# Patient Record
Sex: Male | Born: 1970 | Race: White | Hispanic: No | Marital: Married | Smoking: Never smoker
Health system: Southern US, Community
[De-identification: ages and names within clinical notes are randomized; demographics above are authoritative.]

## PROBLEM LIST (undated history)

## (undated) DIAGNOSIS — J45909 Unspecified asthma, uncomplicated: Secondary | ICD-10-CM

## (undated) DIAGNOSIS — J309 Allergic rhinitis, unspecified: Secondary | ICD-10-CM

## (undated) HISTORY — DX: Unspecified asthma, uncomplicated: J45.909

## (undated) HISTORY — DX: Allergic rhinitis, unspecified: J30.9

---

## 2003-03-03 ENCOUNTER — Encounter: Payer: Self-pay | Admitting: Emergency Medicine

## 2003-03-03 ENCOUNTER — Emergency Department (HOSPITAL_COMMUNITY): Admission: EM | Admit: 2003-03-03 | Discharge: 2003-03-03 | Payer: Self-pay

## 2004-11-27 ENCOUNTER — Ambulatory Visit (HOSPITAL_COMMUNITY): Admission: RE | Admit: 2004-11-27 | Discharge: 2004-11-27 | Payer: Self-pay | Admitting: Family Medicine

## 2004-11-27 ENCOUNTER — Encounter (INDEPENDENT_AMBULATORY_CARE_PROVIDER_SITE_OTHER): Payer: Self-pay | Admitting: Cardiology

## 2006-03-20 ENCOUNTER — Ambulatory Visit: Payer: Self-pay | Admitting: Internal Medicine

## 2006-08-06 ENCOUNTER — Ambulatory Visit: Payer: Self-pay | Admitting: Internal Medicine

## 2007-04-06 ENCOUNTER — Emergency Department (HOSPITAL_COMMUNITY): Admission: EM | Admit: 2007-04-06 | Discharge: 2007-04-06 | Payer: Self-pay | Admitting: Emergency Medicine

## 2007-04-08 ENCOUNTER — Encounter: Admission: RE | Admit: 2007-04-08 | Discharge: 2007-04-08 | Payer: Self-pay | Admitting: Sports Medicine

## 2007-08-17 ENCOUNTER — Ambulatory Visit: Payer: Self-pay | Admitting: Internal Medicine

## 2007-10-28 ENCOUNTER — Emergency Department (HOSPITAL_COMMUNITY): Admission: EM | Admit: 2007-10-28 | Discharge: 2007-10-29 | Payer: Self-pay | Admitting: Emergency Medicine

## 2007-12-08 ENCOUNTER — Ambulatory Visit: Payer: Self-pay | Admitting: Internal Medicine

## 2007-12-08 DIAGNOSIS — J45998 Other asthma: Secondary | ICD-10-CM

## 2007-12-08 DIAGNOSIS — J3089 Other allergic rhinitis: Secondary | ICD-10-CM

## 2007-12-08 DIAGNOSIS — J4520 Mild intermittent asthma, uncomplicated: Secondary | ICD-10-CM | POA: Insufficient documentation

## 2007-12-08 DIAGNOSIS — J302 Other seasonal allergic rhinitis: Secondary | ICD-10-CM

## 2008-02-17 ENCOUNTER — Ambulatory Visit: Payer: Self-pay | Admitting: Internal Medicine

## 2009-02-15 ENCOUNTER — Ambulatory Visit: Payer: Self-pay | Admitting: Internal Medicine

## 2009-04-11 ENCOUNTER — Telehealth (INDEPENDENT_AMBULATORY_CARE_PROVIDER_SITE_OTHER): Payer: Self-pay | Admitting: *Deleted

## 2009-12-26 ENCOUNTER — Telehealth (INDEPENDENT_AMBULATORY_CARE_PROVIDER_SITE_OTHER): Payer: Self-pay | Admitting: *Deleted

## 2010-01-01 ENCOUNTER — Ambulatory Visit: Payer: Self-pay | Admitting: Internal Medicine

## 2010-01-01 DIAGNOSIS — J019 Acute sinusitis, unspecified: Secondary | ICD-10-CM | POA: Insufficient documentation

## 2010-02-27 ENCOUNTER — Telehealth (INDEPENDENT_AMBULATORY_CARE_PROVIDER_SITE_OTHER): Payer: Self-pay | Admitting: *Deleted

## 2010-11-04 ENCOUNTER — Encounter: Payer: Self-pay | Admitting: Family Medicine

## 2010-11-15 NOTE — Progress Notes (Signed)
Summary: appt  Phone Note Call from Patient Call back at [phone]   Caller: Patient Call For: young Reason for Call: Talk to Nurse Summary of Call: asthma related congestion in head and chest area.  Cough, wheezing, worse at night.  Want to see Dr. Maple Hudson or NP Initial call taken by: Eugene Gavia,  December 26, 2009 10:46 AM  Follow-up for Phone Call        called and spoke with pt.   pt c/o head congestion, difficulty blowing anything out of nose, facial pressure, wheezing at night.  symptoms started 3 weeks ago.  pt denied cough or fever.  pt states his pcp gave him Bactrium a while ago which helped but didn't clear up symptoms. Please advise.  Thank you.  Aundra Millet Reynolds LPN  December 26, 2009 10:53 AM   allergies:  PCN,  tetracycline  Additional Follow-up for Phone Call Additional follow up Details #1::        Offer prednisone 20 mg , # 5, 1 daily. Appointment with me. Additional Follow-up by: Waymon Budge MD,  December 26, 2009 1:23 PM    Additional Follow-up for Phone Call Additional follow up Details #2::    Katie, can you please clarify CY response. Doe she want pt to have appt this week? If so where? does he want me to give pt pred rx and appt? Please advise. Carron Curie CMA  December 26, 2009 2:13 PM  Please have pt come in to see CDY Friday in 1115am slot and be here at 11am for appt.Reynaldo Minium CMA  December 27, 2009 2:24 PM   Appt sched for 12/29/09 at 11:15 with Dr Maple Hudson.  Advised pt to come in at 11 am.  Pt verbalized understanding. Follow-up by: Vernie Murders,  December 27, 2009 2:30 PM

## 2010-11-15 NOTE — Progress Notes (Signed)
Summary: [id number]  Phone Note Call from Patient Call back at Home Phone [phone]   Caller: Patient Call For: YOUNG Summary of Call: NEEDS REFILL ON [facility] Initial call taken by: Lacinda Axon,  Feb 27, 2010 12:58 PM  Follow-up for Phone Call        nasonex not on med list, but flonase is? LMTCB to ask pt which one he is taking. Carron Curie CMA  Feb 27, 2010 1:24 PM  Returning phone call, pt cell is [phone] Darletta Moll  Feb 27, 2010 1:28 PM  [facility].  Arman Filter LPN  Feb 27, 2010 2:44 PM   LMOMTCB Vernie Murders  Feb 27, 2010 3:35 PM     Additional Follow-up for Phone Call Additional follow up Details #1::        Spoke with pt.  He states that it is the flonase that he has been taking and not nasonex.  Rx was refilled. Additional Follow-up by: Vernie Murders,  Feb 27, 2010 3:40 PM    Prescriptions: Aleda Grana 50 MCG/ACT SUSP (FLUTICASONE PROPIONATE) 1-2 sprays in each nostril once daily as needed  #1 x 5   Entered by:   Vernie Murders   Authorized by:   Waymon Budge MD   Signed by:   Vernie Murders on 02/27/2010   Method used:   Electronically to        [facility] [address] [phone]* (retail)       [address] [place], [place]  [address]       Ph: [phone]       Fax: [phone]   RxID:   [id number]

## 2010-11-15 NOTE — Assessment & Plan Note (Signed)
Summary: Acute NP office visit - sinus inf   Primary Provider/Referring Provider:  Bradd Canary  CC:  sinus pressure/congestion with yellow drainage and PND causing chest tightness x3weeks - was given bactrim for the sx by PCP 3wks ago.  would like refills of theophylline and proventil sent to [facility]  History of Present Illness: 5/6/09ALLERGIC RHINITIS (ICD-477.9) ASTHMA (ICD-493.90)  [name] returns for follow-up, being seen once per year.  He feels well today, needing refills.  He admits the pollen season is causing nasal congestion and pressure in his sinuses and ears.  His eyes itch, and water.  This is a problem for two or 3 weeks each year.  He does not get chest tightness, or wheeze.  Theophylline works well for him as a maintenance and preventive medicine taking 300 mg twice daily.  We discussed theophylline side effects. He denies breathing trouble at night and is not told that he snores.  02/15/09 Asthma, allergic rhinitis Notes chest tight after lawn mowing. Used flonase and claritin appropriately during heaviest spring pollen. Asks 90 day med refils. Rarely needing rescue inhaler- discused rescue vs maintenance.  January 01, 2010 --Presents for an acute office visit Complains of sinus pressure/congestion with yellow drainage, PND causing chest tightness x3weeks - was given bactrim for the sx by PCP 3wks ago.  would like refills of theophylline and proventil sent to [facility].. Denies chest pain, dyspnea, orthopnea, hemoptysis, fever, n/v/d, edema, headache.  Sinus pressure will not clear.    Medications Prior to Update: 1)  Theophylline Cr 300 Mg  Tb12 (Theophylline) .... Take 1 Tablet By Mouth Two Times A Day 2)  Claritin 10 Mg  Tabs (Loratadine) .... As Needed 3)  Proventil Hfa 108 (90 Base) Mcg/act  Aers (Albuterol Sulfate) .Marland Kitchen.. 1-2 Puffs Every 4-6 Hours As Needed 4)  Flonase 50 Mcg/act Susp (Fluticasone Propionate) .Marland Kitchen.. 1-2 Sprays in Each Nostril Once Daily As Needed  Current  Medications (verified): 1)  Theophylline Cr 300 Mg  Tb12 (Theophylline) .... Take 1 Tablet By Mouth Two Times A Day 2)  Claritin 10 Mg  Tabs (Loratadine) .... As Needed 3)  Proventil Hfa 108 (90 Base) Mcg/act  Aers (Albuterol Sulfate) .Marland Kitchen.. 1-2 Puffs Every 4-6 Hours As Needed 4)  Flonase 50 Mcg/act Susp (Fluticasone Propionate) .Marland Kitchen.. 1-2 Sprays in Each Nostril Once Daily As Needed  Allergies (verified): 1)  ! Penicillin 2)  ! Tetracycline  Past History:  Past Medical History: Last updated: 02/17/2008 ALLERGIC RHINITIS (ICD-477.9) ASTHMA (ICD-493.90)    Past Surgical History: Last updated: 02/15/2009 None  Family History: Last updated: 02/15/2009 Parents living- denies known allergy/ asthma- 2010  Social History: Last updated: 01/01/2010 Patient never smoked.  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  would like refills of theophylline and proventil sent to [facility]. Is Patient Diabetic? No Comments Medications reviewed with patient Daytime contact number verified with patient. Boone Master CNA  January 01, 2010 10:56 AM    Physical Exam  Additional Exam:  General: A/Ox3; pleasant and cooperative, NAD, trim SKIN: no rash, lesions NODES: no lymphadenopathy HEENT: [place]/AT, EOM- WNL, Conjuctivae- clear, PERRLA,  TM-WNL, Nose- white mucus bridging, sinus pressure. Throat- clear and wnl, Melampatti II NECK: Supple w/ fair ROM, JVD- none, normal carotid impulses w/o bruits Thyroid- normal to palpation CHEST: Clear to P&A HEART: RRR, no m/g/r heard ABDOMEN: Soft and nl; nml bowel sounds; no organomegaly or masses noted ZOX:WRUE, nl pulses, no edema  NEURO: Grossly intact to observation      Impression & Recommendations:  Problem # 1:  ACUTE SINUSITIS, UNSPECIFIED (ICD-461.9)  Omnicef 300mg  two times a day for 10 days Prednisone taper over next week.  Saline nasal rinses as needed  Flonase 2 puffs two times a day  Mucinex two times a day as needed cough/congestion  Please contact office for sooner follow up if symptoms do not improve or worsen  His updated medication list for this problem includes:    Flonase 50 Mcg/act Susp (Fluticasone propionate) .Marland Kitchen... 1-2 sprays in each nostril once daily as needed    Cefdinir 300 Mg Caps (Cefdinir) .Marland Kitchen... 1 by mouth two times a day  Orders: Est. Patient Level III (45[address])  Medications Added to Medication List This Visit: 1)  Cefdinir 300 Mg Caps (Cefdinir) .Marland Kitchen.. 1 by mouth two times a day 2)  Prednisone 10 Mg Tabs (Prednisone) .... 4 tabs for 2 days, then 3 tabs for 2 days, 2 tabs for 2 days, then 1 tab for 2 days, then stop  Complete Medication List: 1)  Theophylline Cr 300 Mg Tb12 (Theophylline) .... Take 1 tablet by mouth two times a day 2)  Claritin 10 Mg Tabs (Loratadine) .... As needed 3)  Proventil Hfa 108 (90 Base) Mcg/act Aers (Albuterol sulfate) .Marland Kitchen.. 1-2 puffs every 4-6 hours as needed 4)  Flonase 50 Mcg/act Susp (Fluticasone propionate) .Marland Kitchen.. 1-2 sprays in each nostril once daily as needed 5)  Cefdinir 300 Mg Caps (Cefdinir) .Marland Kitchen.. 1 by mouth two times a day 6)  Prednisone 10 Mg Tabs (Prednisone) .... 4 tabs for 2 days, then 3 tabs for 2 days, 2 tabs for 2 days, then 1 tab for 2 days, then stop  Patient Instructions: 1)  Omnicef 300mg  two times  a day for 10 days 2)  Prednisone taper over next week.  3)  Saline nasal rinses as needed  4)  Flonase 2 puffs two times a day  5)  Mucinex two times a day as needed cough/congestion  6)  Please contact office for sooner follow up if symptoms do not improve or worsen  7)  follow up Dr. Maple Hudson as scheduled.  Prescriptions: PREDNISONE 10 MG TABS (PREDNISONE) 4 tabs for 2 days, then 3 tabs for 2 days, 2 tabs for 2 days, then 1 tab for 2 days, then stop  #20 x 0   Entered and Authorized by:   Rubye Oaks NP   Signed by:   Tammy Parrett NP on 01/01/2010   Method used:   Electronically to        [facility] [phone]* (retail)       [phone]  [place], [place]  [address]       Ph: [phone]       Fax: [phone]   RxID:   [id number] CEFDINIR 300 MG CAPS (CEFDINIR) 1 by mouth two times a day  #20 x 0   Entered and Authorized by:   Rubye Oaks NP   Signed by:   Rubye Oaks NP on 01/01/2010   Method used:   Electronically to        [facility] [phone]* (retail)       [address] [place], [place]  [address]       Ph: [phone]       Fax: [phone]   RxID:   [id number]    Immunization History:  Influenza Immunization History:    Influenza:  historical (07/15/2007)

## 2011-02-25 ENCOUNTER — Other Ambulatory Visit: Payer: Self-pay | Admitting: Internal Medicine

## 2011-02-26 NOTE — Assessment & Plan Note (Signed)
[facility]                             PULMONARY OFFICE NOTE   NAME:[name] [name], [name] [name]                       MRN:          [id number]  DATE:08/17/2007                            DOB:          [birth year]    HISTORY OF PRESENT ILLNESS:  The patient is a 40 year old white male  patient of Dr. Roxy Cedar who has a known history of asthma, allergic  rhinitis, and a history of nasal congestion, cough, wheezing, and sore  throat.  The patient was initially seen by his primary care physician  and prescribed a Z-pack which he is on day 5 of 5 today.  The patient's  symptoms have not improved and, in fact, over the last 2 days, have  worsened with intermittent nocturnal wheezing and a sinus pain and  pressure.   PAST MEDICAL HISTORY:  Reviewed.  MEDICATIONS  Reviewed   PHYSICAL EXAM:  Pleasant male in no acute distress.  He is afebrile with stable vital signs.  O2 saturation is 98% on room  air.  HEENT:  Nasal mucosa with some mild erythema.  Maxillary sinus  tenderness.  The posterior pharynx is clear.  NECK:  Supple without cervical adenopathy.  No JVD.  LUNG SOUNDS:  Coarse breath sounds without any wheezing.  No crackles.  CARDIAC:  Regular rate.  ABDOMEN:  Soft and nontender.  EXTREMITIES:  Warm without any edema.   IMPRESSION AND PLAN:  Acute rhinosinusitis.  The patient is to begin  Omnicef x10 days.  Nasal hygiene regimen with Afrin nasal spray x5 days.  Mucinex DM twice daily.  The patient was given prescription for steroids  to have on hold in case symptoms worsen with persistent wheezing.  The  patient was given Xopenex Neb treatment today in the office.  The  patient is to return back with Dr. Maple Hudson as scheduled or sooner if  needed.      Rubye Oaks, NP  Electronically Signed      Clinton D. Maple Hudson, MD, Tonny Bollman, FACP  Electronically Signed   TP/MedQ  DD: 08/17/2007  DT: 08/18/2007  Job #: [id number]

## 2011-03-01 NOTE — Assessment & Plan Note (Signed)
[facility]                               PULMONARY OFFICE NOTE   NAME:[name], [name]                       MRN:          [id number]  DATE:08/06/2006                            DOB:          August 31, 1971    PROBLEM:  1. Asthma.  2. Allergic rhinitis.   HISTORY:  Since last year in June, he had done pretty well. In the past  week, he has had head congestion moving into his chest, which scares him. He  has begun to wheeze and is coughing thick, non-purulent looking sputum. He  had moved and stirred up a lot of house dust in the process, which he thinks  is one of the triggers. He has stayed on theophylline at the relatively high  dose of 300 mg b.i.d., but indicates that he tolerates it well by discussion  of symptoms.   MEDICATIONS:  1. Theophylline 300 mg b.i.d.  2. Lipitor.  3. Albuterol rescue inhaler.  4. Claritin.   DRUG INTOLERANCES:  PENICILLIN.   OBJECTIVE:  Weight: 170 pounds.  Blood pressure: 104/60.  Pulse: Regular,  54.  Room air saturation: 99%.  Dry cough, unlabored without wheezing. Nose and throat are clear. I do not  find adenopathy or edema.  Heart sounds are normal.   IMPRESSION:  Probable viral upper respiratory syndrome aggravated by dust  exposure as described with a rhinitis and asthmatic bronchitis.   PLAN:  1. Nebulizer treatment, Xopenex 1.25 mg.  2. Depo-Medrol 80 mg IM with steroid talk.  3. He is given a prescription for a prednisone taper to hold, with      discussion.  4. Z-Pak to hold.  5. Blood for CBC and for theophylline level drawn on 300 mg b.i.d.  6. Schedule return one year, but earlier p.r.n.       [name] D. Maple Hudson, MD, FCCP, FACP      CDY/MedQ  DD:  08/06/2006  DT:  08/07/2006  Job #:  [id number]   cc:   [name] [name], M.D.

## 2011-03-18 ENCOUNTER — Telehealth: Payer: Self-pay | Admitting: *Deleted

## 2011-03-18 NOTE — Telephone Encounter (Signed)
Pt has not been seen since March 2011- Needs appt for refills! Spoke with pharmacist and notified of this and she verbalized understanding. She states that this was an automated request, so the pt may not have even requested this, but will send letter to pt that he needs appt for rx refills.

## 2011-04-03 ENCOUNTER — Telehealth: Payer: Self-pay | Admitting: Internal Medicine

## 2011-04-03 ENCOUNTER — Encounter: Payer: Self-pay | Admitting: Internal Medicine

## 2011-04-03 NOTE — Telephone Encounter (Signed)
Per chart, theophylline was denied b/c pt is overdue for appt > last ov w/ CDY was 02/2009.  Called spoke with patient, informed him of this.  Appt scheduled with CDY 6.26.12 @ 1100.  Pt okay with this date and time and is aware he will receive refills at ov (pt states he has enough to last him until then).

## 2011-04-08 ENCOUNTER — Encounter: Payer: Self-pay | Admitting: Internal Medicine

## 2011-04-09 ENCOUNTER — Encounter: Payer: Self-pay | Admitting: Internal Medicine

## 2011-04-09 ENCOUNTER — Ambulatory Visit (INDEPENDENT_AMBULATORY_CARE_PROVIDER_SITE_OTHER): Payer: BC Managed Care – PPO | Admitting: Internal Medicine

## 2011-04-09 VITALS — BP 122/80 | HR 67 | Ht 71.0 in | Wt 180.8 lb

## 2011-04-09 DIAGNOSIS — J45909 Unspecified asthma, uncomplicated: Secondary | ICD-10-CM

## 2011-04-09 DIAGNOSIS — J309 Allergic rhinitis, unspecified: Secondary | ICD-10-CM

## 2011-04-09 MED ORDER — THEOPHYLLINE ER 300 MG PO CP24: 300.0000 mg | ORAL_CAPSULE | Freq: Two times a day (BID) | ORAL | Status: DC

## 2011-04-09 MED ORDER — ALBUTEROL SULFATE HFA 108 (90 BASE) MCG/ACT IN AERS: 2.0000 | INHALATION_SPRAY | RESPIRATORY_TRACT | Status: DC | PRN

## 2011-04-09 MED ORDER — FLUTICASONE PROPIONATE 50 MCG/ACT NA SUSP: 2.0000 | Freq: Every day | NASAL | Status: DC

## 2011-04-09 NOTE — Patient Instructions (Signed)
Meds refilled  Please call as needed 

## 2011-04-09 NOTE — Progress Notes (Signed)
  Subjective:    Patient ID: [name] [name], male    DOB: 1971-01-05, 40 y.o.   MRN: [id number]  HPI 04/09/11- 39 yoM never smoker, followed for Asthma, allergic rhinitis with hx sinusitis Last here January 01, 2010- note reviewed No longer notices chest tightness after mowing. He expects about one asthma flare/ year, usually from a cold. Still uses theophylline and thinks he is fine with his meds. We discussed more widely used alternatives, such as steroid inhalers, and reviewed common side effects of theophylline. He is satisfied to stay with this for now. Uses Proair only every 3-4 months. Uses flonase every day w/o nose bleeds, with rare claritin.   Review of Systems Constitutional:   No weight loss, night sweats,  Fevers, chills, fatigue, lassitude. HEENT:   No headaches,  Difficulty swallowing,  Tooth/dental problems,  Sore throat,                No sneezing, itching, ear ache, nasal congestion, post nasal drip,   CV:  No chest pain,  Orthopnea, PND, swelling in lower extremities, anasarca, dizziness, palpitations  GI  No heartburn, indigestion, abdominal pain, nausea, vomiting, diarrhea, change in bowel habits, loss of appetite  Resp: No shortness of breath with exertion or at rest.  No excess mucus, no productive cough,  No non-productive cough,  No coughing up of blood.  No change in color of mucus.  No wheezing.   Skin: no rash or lesions.  GU: no dysuria, change in color of urine, no urgency or frequency.  No flank pain.  MS:  No joint pain or swelling.  No decreased range of motion.  No back pain.  Psych:  No change in mood or affect. No depression or anxiety.  No memory loss.      Objective:   Physical Exam General- Alert, Oriented, Affect-appropriate, Distress- none acute  Skin- rash-none, lesions- none, excoriation- none  Lymphadenopathy- none  Head- atraumatic  Eyes- Gross vision intact, PERRLA, conjunctivae clear secretions  Ears- Hearing, canals, Tm -  normal  Nose- Clear, No- Septal dev, mucus, polyps, erosion, perforation   Throat- Mallampati III , mucosa clear , drainage- none, tonsils- atrophic  Neck- flexible , trachea midline, no stridor , thyroid nl, carotid no bruit  Chest - symmetrical excursion , unlabored     Heart/CV- RRR , no murmur , no gallop  , no rub, nl s1 s2                     - JVD- none , edema- none, stasis changes- none, varices- none     Lung- clear to P&A, wheeze- none, cough- none , dullness-none, rub- none     Chest wall-   Abd- tender-no, distended-no, bowel sounds-present, HSM- no  Br/ Gen/ Rectal- Not done, not indicated  Extrem- cyanosis- none, clubbing, none, atrophy- none, strength- nl  Neuro- grossly intact to observation         Assessment & Plan:

## 2011-04-09 NOTE — Assessment & Plan Note (Signed)
It is unusual to use theophylline as the foundation now, but he does well with it, denying side effects on review. We see no need to change what is working well for him.

## 2011-04-09 NOTE — Assessment & Plan Note (Signed)
He is tolerating flonase without problem and can be continued.

## 2011-04-14 ENCOUNTER — Encounter: Payer: Self-pay | Admitting: Internal Medicine

## 2011-04-18 NOTE — Telephone Encounter (Signed)
Will sign off on this message as this was created as a refill enc. Not a phone encounter.  See phone encounter for complete details.

## 2011-04-18 NOTE — Telephone Encounter (Signed)
PHARMACIST HAS QUESTION ABOUT PRESCRIPTION.  Fuller Song [facility] PHARMACIST BACK AT [phone], REF # [id number]

## 2011-04-19 ENCOUNTER — Telehealth: Payer: Self-pay | Admitting: Internal Medicine

## 2011-04-19 NOTE — Telephone Encounter (Signed)
Pharmacy was calling because rx was sent for theo 300mg  24 hour tablet twice daily, but this is only a once a day med. According to EMR pt was taking theo 300mg  12 hour tabs twice daily. Entry error into epic. OV note states to continue theo twice daily, so [facility] advised and med list corrected.Carron Curie, CMA

## 2011-07-04 LAB — URINALYSIS, ROUTINE W REFLEX MICROSCOPIC
Bilirubin Urine: NEGATIVE
Glucose, UA: NEGATIVE
Hgb urine dipstick: NEGATIVE
Ketones, ur: NEGATIVE
pH: 6

## 2011-07-04 LAB — COMPREHENSIVE METABOLIC PANEL
Alkaline Phosphatase: 66
BUN: 9
Glucose, Bld: 111 — ABNORMAL HIGH
Potassium: 4.7
Total Protein: 7.3

## 2011-07-04 LAB — CBC
HCT: 45.4
Hemoglobin: 15.6
MCHC: 34.3
RDW: 13.9

## 2011-07-04 LAB — DIFFERENTIAL
Basophils Absolute: 0
Basophils Relative: 0
Monocytes Relative: 6
Neutro Abs: 8.5 — ABNORMAL HIGH
Neutrophils Relative %: 76

## 2011-07-04 LAB — LIPASE, BLOOD: Lipase: 25

## 2012-03-14 ENCOUNTER — Other Ambulatory Visit: Payer: Self-pay | Admitting: Internal Medicine

## 2012-04-08 ENCOUNTER — Encounter: Payer: Self-pay | Admitting: Internal Medicine

## 2012-04-08 ENCOUNTER — Ambulatory Visit (INDEPENDENT_AMBULATORY_CARE_PROVIDER_SITE_OTHER): Payer: BC Managed Care – PPO | Admitting: Internal Medicine

## 2012-04-08 VITALS — BP 120/88 | HR 65 | Ht 71.0 in | Wt 183.8 lb

## 2012-04-08 DIAGNOSIS — J45909 Unspecified asthma, uncomplicated: Secondary | ICD-10-CM

## 2012-04-08 DIAGNOSIS — J45998 Other asthma: Secondary | ICD-10-CM

## 2012-04-08 MED ORDER — ALBUTEROL SULFATE HFA 108 (90 BASE) MCG/ACT IN AERS: INHALATION_SPRAY | RESPIRATORY_TRACT | Status: DC

## 2012-04-08 MED ORDER — THEOPHYLLINE ER 300 MG PO TB12: 300.0000 mg | ORAL_TABLET | Freq: Two times a day (BID) | ORAL | Status: DC

## 2012-04-08 MED ORDER — FLUTICASONE PROPIONATE 50 MCG/ACT NA SUSP: 2.0000 | Freq: Every day | NASAL | Status: DC

## 2012-04-08 NOTE — Patient Instructions (Addendum)
Refill scripts for theophyline, fluticasone and albuterol HFA  Please call as needed

## 2012-04-08 NOTE — Progress Notes (Signed)
Subjective:    Patient ID: [name] [name], male    DOB: [birth year], 41 y.o.   MRN: [id number]  HPI 04/09/11- 39 yoM never smoker, followed for Asthma, allergic rhinitis with hx sinusitis Last here January 01, 2010- note reviewed No longer notices chest tightness after mowing. He expects about one asthma flare/ year, usually from a cold. Still uses theophylline and thinks he is fine with his meds. We discussed more widely used alternatives, such as steroid inhalers, and reviewed common side effects of theophylline. He is satisfied to stay with this for now. Uses Proair only every 3-4 months. Uses flonase every day w/o nose bleeds, with rare claritin.   04/08/12- 39 yoM never smoker, followed for Asthma, allergic rhinitis with hx sinusitis Reports doing well with no complaints.  Good year with no special problems. Continues daily steroid nasal spray with no epistaxis. Still using theophylline which he finds helpful and well tolerated. If he misses a dose of theophylline his chest gets tight. Rare need .for rescue inhaler  ROS-see HPI Constitutional:   No-   weight loss, night sweats, fevers, chills, fatigue, lassitude. HEENT:   No-  headaches, difficulty swallowing, tooth/dental problems, sore throat,       No-  sneezing, itching, ear ache, nasal congestion, post nasal drip,  CV:  No-   chest pain, orthopnea, PND, swelling in lower extremities, anasarca,  dizziness, palpitations Resp: No-   shortness of breath with exertion or at rest.              No-   productive cough,  No non-productive cough,  No- coughing up of blood.              No-   change in color of mucus.  No- wheezing.   Skin: No-   rash or lesions. GI:  No-   heartburn, indigestion, abdominal pain, nausea, vomiting,  GU:  MS:  No-   joint pain or swelling.  Is  Neuro-     nothing unusual Psych:  No- change in mood or affect. No depression or anxiety.  No memory loss.  OBJ- Physical Exam General- Alert, Oriented,  Affect-appropriate, Distress- none acute Skin- rash-none, lesions- none, excoriation- none Lymphadenopathy- none Head- atraumatic            Eyes- Gross vision intact, PERRLA, conjunctivae and secretions clear            Ears- Hearing, canals-normal            Nose- Clear, no-Septal dev, mucus, polyps, erosion, perforation             Throat- Mallampati III , mucosa clear , drainage- none, tonsils- atrophic Neck- flexible , trachea midline, no stridor , thyroid nl, carotid no bruit Chest - symmetrical excursion , unlabored           Heart/CV- RRR , no murmur , no gallop  , no rub, nl s1 s2                           - JVD- none , edema- none, stasis changes- none, varices- none           Lung- clear to P&A, wheeze- none, cough- none , dullness-none, rub- none           Chest wall-  Abd-  Br/ Gen/ Rectal- Not done, not indicated Extrem- cyanosis- none, clubbing, none, atrophy- none, strength- nl Neuro- grossly intact to observation

## 2012-04-12 NOTE — Assessment & Plan Note (Signed)
Mild intermittent asthma, well controlled. We had another medication talk about his preference for theophylline but he clearly tolerates it very well and finds it effective.

## 2012-11-19 ENCOUNTER — Other Ambulatory Visit: Payer: Self-pay | Admitting: Internal Medicine

## 2013-04-07 ENCOUNTER — Ambulatory Visit: Payer: BC Managed Care – PPO | Admitting: Internal Medicine

## 2013-04-28 ENCOUNTER — Ambulatory Visit (INDEPENDENT_AMBULATORY_CARE_PROVIDER_SITE_OTHER): Payer: BC Managed Care – PPO | Admitting: Internal Medicine

## 2013-04-28 ENCOUNTER — Encounter: Payer: Self-pay | Admitting: Internal Medicine

## 2013-04-28 VITALS — BP 126/62 | HR 52 | Ht 71.0 in | Wt 183.2 lb

## 2013-04-28 DIAGNOSIS — J302 Other seasonal allergic rhinitis: Secondary | ICD-10-CM

## 2013-04-28 DIAGNOSIS — J45998 Other asthma: Secondary | ICD-10-CM

## 2013-04-28 DIAGNOSIS — J45909 Unspecified asthma, uncomplicated: Secondary | ICD-10-CM

## 2013-04-28 DIAGNOSIS — J309 Allergic rhinitis, unspecified: Secondary | ICD-10-CM

## 2013-04-28 MED ORDER — THEOPHYLLINE ER 300 MG PO TB12: 300.0000 mg | ORAL_TABLET | Freq: Two times a day (BID) | ORAL | Status: DC

## 2013-04-28 MED ORDER — FLUTICASONE PROPIONATE 50 MCG/ACT NA SUSP: 2.0000 | Freq: Every day | NASAL | Status: DC

## 2013-04-28 MED ORDER — ALBUTEROL SULFATE HFA 108 (90 BASE) MCG/ACT IN AERS: INHALATION_SPRAY | RESPIRATORY_TRACT | Status: DC

## 2013-04-28 NOTE — Progress Notes (Signed)
Subjective:    Patient ID: [name] [name], male    DOB: [birth year], 42 y.o.   MRN: [id number]  HPI 04/09/11- 39 yoM never smoker, followed for Asthma, allergic rhinitis with hx sinusitis Last here January 01, 2010- note reviewed No longer notices chest tightness after mowing. He expects about one asthma flare/ year, usually from a cold. Still uses theophylline and thinks he is fine with his meds. We discussed more widely used alternatives, such as steroid inhalers, and reviewed common side effects of theophylline. He is satisfied to stay with this for now. Uses Proair only every 3-4 months. Uses flonase every day w/o nose bleeds, with rare claritin.   04/08/12- 39 yoM never smoker, followed for Asthma, allergic rhinitis with hx sinusitis Reports doing well with no complaints.  Good year with no special problems. Continues daily steroid nasal spray with no epistaxis. Still using theophylline which he finds helpful and well tolerated. If he misses a dose of theophylline his chest gets tight. Rare need .for rescue inhaler  04/28/13- 41 yoM never smoker, followed for Asthma, allergic rhinitis with hx sinusitis FOLLOWS FOR: yearly follow up; reports breathing is doing well.  no new complaints. Likes theophylline. Doing well. Meds discusse  ROS-see HPI Constitutional:   No-   weight loss, night sweats, fevers, chills, fatigue, lassitude. HEENT:   No-  headaches, difficulty swallowing, tooth/dental problems, sore throat,       No-  sneezing, itching, ear ache, nasal congestion, post nasal drip,  CV:  No-   chest pain, orthopnea, PND, swelling in lower extremities, anasarca,  dizziness, palpitations Resp: No-   shortness of breath with exertion or at rest.              No-   productive cough,  No non-productive cough,  No- coughing up of blood.              No-   change in color of mucus.  No- wheezing.   Skin: No-   rash or lesions. GI:  No-   heartburn, indigestion, abdominal pain, nausea, vomiting,   GU:  MS:  No-   joint pain or swelling.  Neuro-     nothing unusual Psych:  No- change in mood or affect. No depression or anxiety.  No memory loss.  OBJ- Physical Exam General- Alert, Oriented, Affect-appropriate, Distress- none acute. Medium build Skin- rash-none, lesions- none, excoriation- none Lymphadenopathy- none Head- atraumatic            Eyes- Gross vision intact, PERRLA, conjunctivae and secretions clear            Ears- Hearing, canals-normal            Nose- Clear, no-Septal dev, mucus, polyps, erosion, perforation             Throat- Mallampati III , mucosa clear , drainage- none, tonsils- atrophic Neck- flexible , trachea midline, no stridor , thyroid nl, carotid no bruit Chest - symmetrical excursion , unlabored           Heart/CV- RRR , no murmur , no gallop  , no rub, nl s1 s2                           - JVD- none , edema- none, stasis changes- none, varices- none           Lung- clear to P&A, wheeze- none, cough- none , dullness-none, rub- none  Chest wall-  Abd-  Br/ Gen/ Rectal- Not done, not indicated Extrem- cyanosis- none, clubbing, none, atrophy- none, strength- nl Neuro- grossly intact to observation

## 2013-04-28 NOTE — Patient Instructions (Addendum)
Refills sent for albuterol, fluticasone and theophylline to [facility]  Please call as needed

## 2013-05-14 NOTE — Assessment & Plan Note (Signed)
OTC was sufficient this spring. Comfortable now.

## 2013-05-14 NOTE — Assessment & Plan Note (Signed)
Has tolerated and benefited from theophylline so we can continue. Discussed side effects to watch for.

## 2013-09-16 ENCOUNTER — Encounter: Payer: Self-pay | Admitting: Internal Medicine

## 2013-09-16 ENCOUNTER — Ambulatory Visit (INDEPENDENT_AMBULATORY_CARE_PROVIDER_SITE_OTHER): Payer: BC Managed Care – PPO | Admitting: Internal Medicine

## 2013-09-16 VITALS — BP 140/100 | HR 87 | Temp 99.7°F | Ht 71.0 in | Wt 186.0 lb

## 2013-09-16 DIAGNOSIS — J019 Acute sinusitis, unspecified: Secondary | ICD-10-CM

## 2013-09-16 DIAGNOSIS — J45998 Other asthma: Secondary | ICD-10-CM

## 2013-09-16 DIAGNOSIS — J45909 Unspecified asthma, uncomplicated: Secondary | ICD-10-CM

## 2013-09-16 MED ORDER — CLARITHROMYCIN 500 MG PO TABS: ORAL_TABLET | ORAL | Status: DC

## 2013-09-16 MED ORDER — METHYLPREDNISOLONE ACETATE 80 MG/ML IJ SUSP
80.0000 mg | Freq: Once | INTRAMUSCULAR | Status: AC
  Administered 2013-09-16: 80 mg via INTRAMUSCULAR

## 2013-09-16 MED ORDER — LEVALBUTEROL HCL 0.63 MG/3ML IN NEBU
0.6300 mg | INHALATION_SOLUTION | Freq: Once | RESPIRATORY_TRACT | Status: AC
  Administered 2013-09-16: 0.63 mg via RESPIRATORY_TRACT

## 2013-09-16 NOTE — Progress Notes (Signed)
Subjective:    Patient ID: [name] [name], male    DOB: September 26, 1971, 42 y.o.   MRN: [id number]  HPI 04/09/11- 39 yoM never smoker, followed for Asthma, allergic rhinitis with hx sinusitis Last here January 01, 2010- note reviewed No longer notices chest tightness after mowing. He expects about one asthma flare/ year, usually from a cold. Still uses theophylline and thinks he is fine with his meds. We discussed more widely used alternatives, such as steroid inhalers, and reviewed common side effects of theophylline. He is satisfied to stay with this for now. Uses Proair only every 3-4 months. Uses flonase every day w/o nose bleeds, with rare claritin.   04/08/12- 39 yoM never smoker, followed for Asthma, allergic rhinitis with hx sinusitis Reports doing well with no complaints.  Good year with no special problems. Continues daily steroid nasal spray with no epistaxis. Still using theophylline which he finds helpful and well tolerated. If he misses a dose of theophylline his chest gets tight. Rare need .for rescue inhaler  04/28/13- 41 yoM never smoker, followed for Asthma, allergic rhinitis with hx sinusitis FOLLOWS FOR: yearly follow up; reports breathing is doing well.  no new complaints. Likes theophylline. Doing well. Meds discussed  09/16/13- 42 yoM never smoker, followed for Asthma, allergic rhinitis with hx sinusitis ACUTE VISIT:  Congestion in head and chest x2 weeks and worsening.  Today is last day of Z-Pak and taper of pred. Was on prednisone for poison oak and added Z-Pak. Chest tight without wheeze. Mild short of breath. Some green. Retro-orbital headache. Temperature 99.7.  ROS-see HPI Constitutional:   No-   weight loss, night sweats, fevers, chills, fatigue, lassitude. HEENT:   +headaches, difficulty swallowing, tooth/dental problems, sore throat,       No-  sneezing, itching, ear ache, +nasal congestion, post nasal drip,  CV:  No-   chest pain, orthopnea, PND, swelling in lower  extremities, anasarca,  dizziness, palpitations Resp: No-   shortness of breath with exertion or at rest.             +  productive cough,  No non-productive cough,  No- coughing up of blood.              No-   change in color of mucus.  No- wheezing.   Skin: No-   rash or lesions. GI:  No-   heartburn, indigestion, abdominal pain, nausea, vomiting,  GU:  MS:  No-   joint pain or swelling.  Neuro-     nothing unusual Psych:  No- change in mood or affect. No depression or anxiety.  No memory loss.  OBJ- Physical Exam General- Alert, Oriented, Affect-appropriate, Distress- none acute. Medium build Skin- rash-none, lesions- none, excoriation- none Lymphadenopathy- none Head- atraumatic            Eyes- Gross vision intact, PERRLA, conjunctivae and secretions clear            Ears- Hearing, canals-normal            Nose-  no-Septal dev, +mucus, no-polyps, erosion, perforation             Throat- Mallampati III , mucosa clear , drainage- none, tonsils- atrophic Neck- flexible , trachea midline, no stridor , thyroid nl, carotid no bruit Chest - symmetrical excursion , unlabored           Heart/CV- RRR , no murmur , no gallop  , no rub, nl s1 s2                           -  JVD- none , edema- none, stasis changes- none, varices- none           Lung- clear to P&A, wheeze- none, cough- none , dullness-none, rub- none           Chest wall-  Abd-  Br/ Gen/ Rectal- Not done, not indicated Extrem- cyanosis- none, clubbing, none, atrophy- none, strength- nl Neuro- grossly intact to observation

## 2013-09-16 NOTE — Patient Instructions (Signed)
Script for biaxin antibiotic  Depo 80  Neb xop 0.63  Extra fluids and Mucinex may help

## 2013-10-10 NOTE — Assessment & Plan Note (Signed)
Acute upper respiratory infection with bronchitis and early asthma Plan-nebulized Xopenex, Depo-Medrol, Biaxin, fluids

## 2014-04-28 ENCOUNTER — Encounter: Payer: Self-pay | Admitting: Internal Medicine

## 2014-04-28 ENCOUNTER — Ambulatory Visit (INDEPENDENT_AMBULATORY_CARE_PROVIDER_SITE_OTHER): Payer: BC Managed Care – PPO | Admitting: Internal Medicine

## 2014-04-28 VITALS — BP 108/62 | HR 68 | Ht 71.0 in | Wt 184.0 lb

## 2014-04-28 DIAGNOSIS — J309 Allergic rhinitis, unspecified: Secondary | ICD-10-CM

## 2014-04-28 DIAGNOSIS — J3089 Other allergic rhinitis: Secondary | ICD-10-CM

## 2014-04-28 DIAGNOSIS — J45998 Other asthma: Secondary | ICD-10-CM

## 2014-04-28 DIAGNOSIS — J302 Other seasonal allergic rhinitis: Secondary | ICD-10-CM

## 2014-04-28 DIAGNOSIS — J45909 Unspecified asthma, uncomplicated: Secondary | ICD-10-CM

## 2014-04-28 MED ORDER — FLUTICASONE PROPIONATE 50 MCG/ACT NA SUSP: 2.0000 | Freq: Every day | NASAL | Status: DC

## 2014-04-28 MED ORDER — THEOPHYLLINE ER 300 MG PO TB12: 300.0000 mg | ORAL_TABLET | Freq: Two times a day (BID) | ORAL | Status: DC

## 2014-04-28 MED ORDER — ALBUTEROL SULFATE HFA 108 (90 BASE) MCG/ACT IN AERS: INHALATION_SPRAY | RESPIRATORY_TRACT | Status: DC

## 2014-04-28 NOTE — Progress Notes (Signed)
Subjective:    Patient ID: [name] [name], male    DOB: 12/06/1970, 43 y.o.   MRN: [id number]  HPI 04/09/11- 39 yoM never smoker, followed for Asthma, allergic rhinitis with hx sinusitis Last here January 01, 2010- note reviewed No longer notices chest tightness after mowing. He expects about one asthma flare/ year, usually from a cold. Still uses theophylline and thinks he is fine with his meds. We discussed more widely used alternatives, such as steroid inhalers, and reviewed common side effects of theophylline. He is satisfied to stay with this for now. Uses Proair only every 3-4 months. Uses flonase every day w/o nose bleeds, with rare claritin.   04/08/12- 39 yoM never smoker, followed for Asthma, allergic rhinitis with hx sinusitis Reports doing well with no complaints.  Good year with no special problems. Continues daily steroid nasal spray with no epistaxis. Still using theophylline which he finds helpful and well tolerated. If he misses a dose of theophylline his chest gets tight. Rare need .for rescue inhaler  04/28/13- 41 yoM never smoker, followed for Asthma, allergic rhinitis with hx sinusitis FOLLOWS FOR: yearly follow up; reports breathing is doing well.  no new complaints. Likes theophylline. Doing well. Meds discussed  09/16/13- 42 yoM never smoker, followed for Asthma, allergic rhinitis with hx sinusitis ACUTE VISIT:  Congestion in head and chest x2 weeks and worsening.  Today is last day of Z-Pak and taper of pred. Was on prednisone for poison oak and added Z-Pak. Chest tight without wheeze. Mild short of breath. Some green. Retro-orbital headache. Temperature 99.7.  04/28/14- 42 yoM never smoker, followed for Asthma, allergic rhinitis with hx sinusitis FOLLOWS FOR: No flare ups since last visit and doing well. No problems with spring pollen season this year. Still using theophylline 300 mg twice daily and does not want to give that up. Medication talk done.  ROS-see  HPI Constitutional:   No-   weight loss, night sweats, fevers, chills, fatigue, lassitude. HEENT:   +headaches, difficulty swallowing, tooth/dental problems, sore throat,       No-  sneezing, itching, ear ache, +nasal congestion, post nasal drip,  CV:  No-   chest pain, orthopnea, PND, swelling in lower extremities, anasarca,  dizziness, palpitations Resp: No-   shortness of breath with exertion or at rest.             No-productive cough,  No non-productive cough,  No- coughing up of blood.              No-   change in color of mucus.  No- wheezing.   Skin: No-   rash or lesions. GI:  No-   heartburn, indigestion, abdominal pain, nausea, vomiting,  GU:  MS:  No-   joint pain or swelling.  Neuro-     nothing unusual Psych:  No- change in mood or affect. No depression or anxiety.  No memory loss.  OBJ- Physical Exam looks well General- Alert, Oriented, Affect-appropriate, Distress- none acute. Medium build Skin- rash-none, lesions- none, excoriation- none Lymphadenopathy- none Head- atraumatic            Eyes- Gross vision intact, PERRLA, conjunctivae and secretions clear            Ears- Hearing, canals-normal            Nose-  no-Septal dev, mucus-none, no-polyps, erosion, perforation             Throat- Mallampati III , mucosa clear , drainage- none, tonsils- atrophic  Neck- flexible , trachea midline, no stridor , thyroid nl, carotid no bruit Chest - symmetrical excursion , unlabored           Heart/CV- RRR , no murmur , no gallop  , no rub, nl s1 s2                           - JVD- none , edema- none, stasis changes- none, varices- none           Lung- clear to P&A, wheeze- none, cough- none , dullness-none, rub- none           Chest wall-  Abd-  Br/ Gen/ Rectal- Not done, not indicated Extrem- cyanosis- none, clubbing, none, atrophy- none, strength- nl Neuro- grossly intact to observation

## 2014-04-28 NOTE — Patient Instructions (Signed)
We can continue current meds- refills sent for mail order.  Please call as needed

## 2014-07-31 NOTE — Assessment & Plan Note (Signed)
Well controlled Plan-over-the-counter antihistamines if needed

## 2014-07-31 NOTE — Assessment & Plan Note (Signed)
Well controlled Plan-medication talked done. Refills.

## 2014-10-23 ENCOUNTER — Observation Stay (HOSPITAL_BASED_OUTPATIENT_CLINIC_OR_DEPARTMENT_OTHER)
Admission: EM | Admit: 2014-10-23 | Discharge: 2014-10-24 | Disposition: A | Discharge status: Other Acute Inpatient Hospital | Payer: BLUE CROSS/BLUE SHIELD | Attending: Internal Medicine | Admitting: Internal Medicine

## 2014-10-23 ENCOUNTER — Emergency Department (HOSPITAL_BASED_OUTPATIENT_CLINIC_OR_DEPARTMENT_OTHER): Payer: BLUE CROSS/BLUE SHIELD

## 2014-10-23 ENCOUNTER — Encounter (HOSPITAL_BASED_OUTPATIENT_CLINIC_OR_DEPARTMENT_OTHER): Payer: Self-pay | Admitting: Emergency Medicine

## 2014-10-23 DIAGNOSIS — Z79899 Other long term (current) drug therapy: Secondary | ICD-10-CM | POA: Diagnosis not present

## 2014-10-23 DIAGNOSIS — R9431 Abnormal electrocardiogram [ECG] [EKG]: Secondary | ICD-10-CM | POA: Insufficient documentation

## 2014-10-23 DIAGNOSIS — J45909 Unspecified asthma, uncomplicated: Secondary | ICD-10-CM | POA: Diagnosis not present

## 2014-10-23 DIAGNOSIS — R079 Chest pain, unspecified: Secondary | ICD-10-CM | POA: Diagnosis present

## 2014-10-23 DIAGNOSIS — Z7951 Long term (current) use of inhaled steroids: Secondary | ICD-10-CM | POA: Insufficient documentation

## 2014-10-23 DIAGNOSIS — R52 Pain, unspecified: Secondary | ICD-10-CM

## 2014-10-23 DIAGNOSIS — R0789 Other chest pain: Principal | ICD-10-CM | POA: Insufficient documentation

## 2014-10-23 DIAGNOSIS — I10 Essential (primary) hypertension: Secondary | ICD-10-CM | POA: Diagnosis not present

## 2014-10-23 DIAGNOSIS — R1084 Generalized abdominal pain: Secondary | ICD-10-CM | POA: Insufficient documentation

## 2014-10-23 DIAGNOSIS — E785 Hyperlipidemia, unspecified: Secondary | ICD-10-CM | POA: Diagnosis not present

## 2014-10-23 DIAGNOSIS — I200 Unstable angina: Secondary | ICD-10-CM | POA: Diagnosis present

## 2014-10-23 LAB — COMPREHENSIVE METABOLIC PANEL
ALT: 19 U/L (ref 0–53)
AST: 22 U/L (ref 0–37)
Albumin: 4.2 g/dL (ref 3.5–5.2)
Alkaline Phosphatase: 69 U/L (ref 39–117)
Anion gap: 7 (ref 5–15)
BILIRUBIN TOTAL: 1.7 mg/dL — AB (ref 0.3–1.2)
BUN: 15 mg/dL (ref 6–23)
CALCIUM: 9.4 mg/dL (ref 8.4–10.5)
CO2: 28 mmol/L (ref 19–32)
CREATININE: 0.93 mg/dL (ref 0.50–1.35)
Chloride: 101 mEq/L (ref 96–112)
Glucose, Bld: 97 mg/dL (ref 70–99)
Potassium: 3.7 mmol/L (ref 3.5–5.1)
Sodium: 136 mmol/L (ref 135–145)
Total Protein: 8 g/dL (ref 6.0–8.3)

## 2014-10-23 LAB — URINALYSIS, ROUTINE W REFLEX MICROSCOPIC
Bilirubin Urine: NEGATIVE
Glucose, UA: NEGATIVE mg/dL
Hgb urine dipstick: NEGATIVE
KETONES UR: NEGATIVE mg/dL
LEUKOCYTES UA: NEGATIVE
NITRITE: NEGATIVE
PROTEIN: NEGATIVE mg/dL
SPECIFIC GRAVITY, URINE: 1.015 (ref 1.005–1.030)
Urobilinogen, UA: 0.2 mg/dL (ref 0.0–1.0)
pH: 7 (ref 5.0–8.0)

## 2014-10-23 LAB — CBC
HCT: 46.8 % (ref 39.0–52.0)
Hemoglobin: 15.5 g/dL (ref 13.0–17.0)
MCH: 29.8 pg (ref 26.0–34.0)
MCHC: 33.1 g/dL (ref 30.0–36.0)
MCV: 90 fL (ref 78.0–100.0)
Platelets: 285 10*3/uL (ref 150–400)
RBC: 5.2 MIL/uL (ref 4.22–5.81)
RDW: 13.5 % (ref 11.5–15.5)
WBC: 8.3 10*3/uL (ref 4.0–10.5)

## 2014-10-23 LAB — TROPONIN I: Troponin I: <0.03 ng/mL (ref <0.031)

## 2014-10-23 LAB — D-DIMER, QUANTITATIVE (NOT AT ARMC)

## 2014-10-23 LAB — LIPASE, BLOOD: LIPASE: 33 U/L (ref 11–59)

## 2014-10-23 MED ORDER — ATORVASTATIN CALCIUM 40 MG PO TABS
40.0000 mg | ORAL_TABLET | Freq: Every day | ORAL | Status: DC
  Administered 2014-10-23 – 2014-10-24 (×2): 40 mg via ORAL
  Filled 2014-10-23 (×2): qty 1

## 2014-10-23 MED ORDER — NITROGLYCERIN 0.4 MG SL SUBL: 0.4000 mg | SUBLINGUAL_TABLET | SUBLINGUAL | Status: DC | PRN

## 2014-10-23 MED ORDER — ASPIRIN EC 81 MG PO TBEC
81.0000 mg | DELAYED_RELEASE_TABLET | Freq: Every day | ORAL | Status: DC
  Administered 2014-10-24: 81 mg via ORAL
  Filled 2014-10-23: qty 1

## 2014-10-23 MED ORDER — ACETAMINOPHEN 325 MG PO TABS
650.0000 mg | ORAL_TABLET | ORAL | Status: DC | PRN
  Administered 2014-10-23: 650 mg via ORAL
  Filled 2014-10-23: qty 2

## 2014-10-23 MED ORDER — FLUTICASONE PROPIONATE 50 MCG/ACT NA SUSP
2.0000 | Freq: Every day | NASAL | Status: DC
  Administered 2014-10-24: 2 via NASAL
  Filled 2014-10-23: qty 16

## 2014-10-23 MED ORDER — ASPIRIN 81 MG PO CHEW
324.0000 mg | CHEWABLE_TABLET | Freq: Once | ORAL | Status: AC
  Administered 2014-10-23: 324 mg via ORAL
  Filled 2014-10-23: qty 4

## 2014-10-23 MED ORDER — NITROGLYCERIN 0.4 MG SL SUBL
0.4000 mg | SUBLINGUAL_TABLET | SUBLINGUAL | Status: DC | PRN
  Administered 2014-10-23: 0.4 mg via SUBLINGUAL
  Filled 2014-10-23: qty 1

## 2014-10-23 MED ORDER — SODIUM CHLORIDE 0.9 % IV SOLN: 250.0000 mL | INTRAVENOUS | Status: DC | PRN

## 2014-10-23 MED ORDER — SODIUM CHLORIDE 0.9 % IJ SOLN
3.0000 mL | INTRAMUSCULAR | Status: DC | PRN
Start: 2014-10-23 — End: 2014-10-24

## 2014-10-23 MED ORDER — THEOPHYLLINE ER 300 MG PO TB12
300.0000 mg | ORAL_TABLET | Freq: Two times a day (BID) | ORAL | Status: DC
  Administered 2014-10-23 – 2014-10-24 (×2): 300 mg via ORAL
  Filled 2014-10-23 (×4): qty 1

## 2014-10-23 MED ORDER — ALBUTEROL SULFATE HFA 108 (90 BASE) MCG/ACT IN AERS: 1.0000 | INHALATION_SPRAY | Freq: Four times a day (QID) | RESPIRATORY_TRACT | Status: DC | PRN

## 2014-10-23 MED ORDER — SODIUM CHLORIDE 0.9 % IJ SOLN
3.0000 mL | Freq: Two times a day (BID) | INTRAMUSCULAR | Status: DC
  Administered 2014-10-23 – 2014-10-24 (×2): 3 mL via INTRAVENOUS

## 2014-10-23 NOTE — ED Notes (Signed)
Continuing to wait on transport to [facility]

## 2014-10-23 NOTE — ED Provider Notes (Signed)
CSN: [id number]     Arrival date & time 10/23/14  1105 History   First MD Initiated Contact with Patient 10/23/14 1158     Chief Complaint  Patient presents with  . Chest Pain     (Consider location/radiation/quality/duration/timing/severity/associated sxs/prior Treatment) HPI 44 year old male who comes in today complaining of some right flank pain that has been present off and on for a week as well as some chest pressure which started yesterday. He describes a pressure in his chest as indigestion. He feels that when he eats he has had been getting some food pressure in his chest but he has been able to eat and has not had associated worsening back pain, nausea, or vomiting. He is unable to associate the chest pain with any exertion or dyspnea. He has not done any new or different exercise and does not think he has injured his back in any way although he does state he initially thought that it was probably musculoskeletal. He is taken some over-the-counter medications without relief of the back pain. The chest pain began yesterday and has been fairly constant in nature although there have been times when it has been resolved. He denies any history of DVT, PE, coronary artery disease, smoking, or family history of coronary artery disease. Past Medical History  Diagnosis Date  . Allergic rhinitis, cause unspecified   . Unspecified asthma(493.90)    History reviewed. No pertinent past surgical history. No family history on file. History  Substance Use Topics  . Smoking status: Never Smoker   . Smokeless tobacco: Not on file  . Alcohol Use: Yes     Comment: Social    Review of Systems  All other systems reviewed and are negative.     Allergies  Penicillins and Tetracycline  Home Medications   Prior to Admission medications   Medication Sig Start Date End Date Taking? Authorizing Provider  fluticasone (FLONASE) 50 MCG/ACT nasal spray Place 2 sprays into both nostrils daily. 04/28/14  05/17/16 Yes Clinton D Young, MD  loratadine (CLARITIN) 10 MG tablet Take 10 mg by mouth daily as needed.    Yes Historical Provider, MD  theophylline (THEODUR) 300 MG 12 hr tablet Take 1 tablet (300 mg total) by mouth 2 (two) times daily. With food 04/28/14  Yes Waymon Budge, MD  albuterol (PROAIR HFA) 108 (90 BASE) MCG/ACT inhaler 2 puffs every 4 hours if needed, rescue inhaler 04/28/14 05/17/16  Waymon Budge, MD   BP 128/85 mmHg  Pulse 82  Temp(Src) 98.2 F (36.8 C) (Oral)  Resp 16  Ht  (1.803 m)  Wt 182 lb (82.555 kg)  BMI 25.40 kg/m2  SpO2 97% Physical Exam  Constitutional: He is oriented to person, place, and time. He appears well-developed and well-nourished.  HENT:  Head: Normocephalic and atraumatic.  Right Ear: External ear normal.  Left Ear: External ear normal.  Nose: Nose normal.  Mouth/Throat: Oropharynx is clear and moist.  Eyes: Conjunctivae and EOM are normal. Pupils are equal, round, and reactive to light.  Neck: Normal range of motion. Neck supple.  Cardiovascular: Normal rate, regular rhythm, normal heart sounds and intact distal pulses.   Pulmonary/Chest: Effort normal and breath sounds normal. No respiratory distress. He has no wheezes. He exhibits no tenderness.  Abdominal: Soft. Bowel sounds are normal. He exhibits no distension and no mass. There is no tenderness. There is no guarding.  Mild tenderness to palpation in epigastrium.  Musculoskeletal: Normal range of motion.  Neurological: He  is alert and oriented to person, place, and time. He has normal reflexes. He exhibits normal muscle tone. Coordination normal.  Skin: Skin is warm and dry.  Psychiatric: He has a normal mood and affect. His behavior is normal. Judgment and thought content normal.  Nursing note and vitals reviewed.   ED Course  Procedures (including critical care time) Labs Review Labs Reviewed  COMPREHENSIVE METABOLIC PANEL - Abnormal; Notable for the following:    Total  Bilirubin 1.7 (*)    All other components within normal limits  CBC  TROPONIN I  URINALYSIS, ROUTINE W REFLEX MICROSCOPIC  LIPASE, BLOOD    Imaging Review [place] Abdomen Complete  10/23/2014   CLINICAL DATA:  Right flank pain, mid chest pain  EXAM: ULTRASOUND ABDOMEN COMPLETE  COMPARISON:  None.  FINDINGS: Gallbladder: No gallstones or wall thickening visualized. No sonographic Murphy sign noted.  Common bile duct: Diameter: 4 mm  Liver: No focal lesion identified. Within normal limits in parenchymal echogenicity.  IVC: No abnormality visualized.  Pancreas: Visualized portion unremarkable.  Spleen: Size and appearance within normal limits.  Right Kidney: Length: 11.2 cm. Echogenicity within normal limits. No mass or hydronephrosis visualized.  Left Kidney: Length: 12.1 cm. Echogenicity within normal limits. There is a 8 mm anechoic left upper pole renal mass most consistent with a cyst. No hydronephrosis visualized.  Abdominal aorta: No aneurysm visualized.  Other findings: None.  IMPRESSION: 1. No cholelithiasis or sonographic evidence of acute cholecystitis.   Electronically Signed   By: Elige KoHetal  Patel   On: 10/23/2014 15:02   Dg Chest Portable 1 View  10/23/2014   CLINICAL DATA:  Right-sided chest and back pain for 1 day  EXAM: PORTABLE CHEST - 1 VIEW  COMPARISON:  None.  FINDINGS: There is no edema or consolidation. The heart size and pulmonary vascularity are normal. No pneumothorax. No adenopathy. No bone lesions. There is slight mid thoracic dextroscoliosis.  IMPRESSION: No edema or consolidation.   Electronically Signed   By: Bretta BangWilliam  Woodruff M.D.   On: 10/23/2014 12:40     EKG Interpretation   Date/Time:  Sunday October 23 2014 11:13:20 EST Ventricular Rate:  93 PR Interval:  138 QRS Duration: 82 QT Interval:  346 QTC Calculation: 430 R Axis:   79 Text Interpretation:  Normal sinus rhythm Nonspecific T wave abnormality  Abnormal ECG Confirmed by Takesha Steger MD, Myonna Chisom ([id number]) on 10/23/2014  12:09:58  PM      MDM   Final diagnoses:  Pain    1 right mid back pain. Patient has urinalysis pending. He has a mildly elevated bilirubin and I have ordered an ultrasound of his right upper quadrant and lipase.- US normal. 2 chest pain patient with substernal pressure without radiation. Resolved here after one nitroglycerin. EKG has nonspecific ST-T wave changes in the inferior lateral leads with a normal comparison in the system. First troponin is negative. However, given patient's symptoms I feel that coronary artery disease must be assessed, subsequently I spoke to Dr. Rennis GoldenHilty who has [facility] the [facility] of chest pain 3- renal mass seen on us- likely cyst- will require op follow up.    Hilario Quarryanielle S Crystalyn Delia, MD 10/23/14 [phone]

## 2014-10-23 NOTE — ED Notes (Signed)
Pt presents to ED with complaints of chest pressure and right back and flank pain since yesterday.

## 2014-10-23 NOTE — H&P (Signed)
History and Physical  Patient ID: [name] [name] MRN: [id number], SOB: 06-29-1971 44 y.o. Date of Encounter: 10/23/2014, 7:19 PM  Primary Physician: No primary care provider on file. Primary Cardiologist: unassigned  Chief Complaint: chest pain  HPI: 44 y.o. male w/ PMHx significant for allergic rhinitis but otherwise healthy who presented to [facility]  on 10/23/2014 with complaints of chest, back and flank pain. 1 week ago, was at normal healthy baseline (no cardiac history, only risk factor is hyperlipidemia, previously on statin but now off) and lifted weights on Wednesday (squats, legs). Nothing too extreme but nonetheless, the next day, felt muscle pain in his back and flanks that he attributed to lifting. Took some ibuprofen on Friday and had the onset of some mild odynophagia (?pill got stuck sensation), and general sensation of feeling unwell (did not want to go out and eat with family). Chest pressure was first experienced on Saturday. Middle of chest. No associated with exertion or other symptoms. At other times, had heartburn like symptoms (acid feeling, burping) but chest pressure was different. At different time, had pain between his shoulder blades. Also with R flank pain.  No nausea or vomiting. No difficulty urinating, blood in urine, in stool etc. Bowels normal. Had chest pain today when he sought care at the [facility]. No response to nitro (just made him lightheaded) but chest pain resolved on its own. None now.  Workup including U/A was negative for blood. Abd ultrasound negative for gallbladder dz, aortic enlargement. Mention of L up pole renal spot, most likely cyst.  Cxray is benign. Troponin negative. Elevated TBili (elevated prior, ?Gilbert's)  However, EKG is NSR with inferolateral ST flattening/depression (reportedly had EKG in 2009 but I am unable to access MUSE). Repeat done now demonstrates less flattening. Cxray negative for CV abnormalities.   Past Medical History   Diagnosis Date  . Allergic rhinitis, cause unspecified   . Unspecified asthma(493.90)      Surgical History: History reviewed. No pertinent past surgical history.   Home Meds: Prior to Admission medications   Medication Sig Start Date End Date Taking? Authorizing Provider  fluticasone (FLONASE) 50 MCG/ACT nasal spray Place 2 sprays into both nostrils daily. 04/28/14 05/17/16 Yes Clinton D Young, MD  loratadine (CLARITIN) 10 MG tablet Take 10 mg by mouth daily as needed.    Yes Historical Provider, MD  theophylline (THEODUR) 300 MG 12 hr tablet Take 1 tablet (300 mg total) by mouth 2 (two) times daily. With food 04/28/14  Yes Waymon Budge, MD  albuterol [facility] HFA) 108 (90 BASE) MCG/ACT inhaler 2 puffs every 4 hours if needed, rescue inhaler 04/28/14 05/17/16  Waymon Budge, MD    Allergies:  Allergies  Allergen Reactions  . Penicillins   . Tetracycline     History   Social History  . Marital Status: Married    Spouse Name: N/A    Number of Children: N/A  . Years of Education: N/A   Occupational History  . Counselling psychologist    Social History Main Topics  . Smoking status: Never Smoker   . Smokeless tobacco: Not on file  . Alcohol Use: Yes     Comment: Social  . Drug Use: Not on file  . Sexual Activity: Not on file   Other Topics Concern  . Not on file   Social History Narrative     No family history on file.  Review of Systems: General: negative for chills, fever, night sweats or weight changes.  Cardiovascular: negative for chest pain, shortness of breath, dyspnea on exertion, edema, orthopnea, palpitations, paroxysmal nocturnal dyspnea  Dermatological: negative for rash Respiratory: negative for cough or wheezing Urologic: negative for hematuria Abdominal: negative for nausea, vomiting, diarrhea, bright red blood per rectum, melena, or hematemesis Neurologic: negative for visual changes, syncope, or dizziness All other systems reviewed and are otherwise  negative except as noted above.  Labs:   Lab Results  Component Value Date   WBC 8.3 10/23/2014   HGB 15.5 10/23/2014   HCT 46.8 10/23/2014   MCV 90.0 10/23/2014   PLT 285 10/23/2014    Recent Labs Lab 10/23/14 1115  NA 136  K 3.7  CL 101  CO2 28  BUN 15  CREATININE 0.93  CALCIUM 9.4  PROT 8.0  BILITOT 1.7*  ALKPHOS 69  ALT 19  AST 22  GLUCOSE 97    Recent Labs  10/23/14 1115 10/23/14 1655  TROPONINI <0.03 <0.03   No results found for: CHOL, HDL, LDLCALC, TRIG No results found for: DDIMER  Radiology/Studies:  [place] Abdomen Complete  10/23/2014   CLINICAL DATA:  Right flank pain, mid chest pain  EXAM: ULTRASOUND ABDOMEN COMPLETE  COMPARISON:  None.  FINDINGS: Gallbladder: No gallstones or wall thickening visualized. No sonographic Murphy sign noted.  Common bile duct: Diameter: 4 mm  Liver: No focal lesion identified. Within normal limits in parenchymal echogenicity.  IVC: No abnormality visualized.  Pancreas: Visualized portion unremarkable.  Spleen: Size and appearance within normal limits.  Right Kidney: Length: 11.2 cm. Echogenicity within normal limits. No mass or hydronephrosis visualized.  Left Kidney: Length: 12.1 cm. Echogenicity within normal limits. There is a 8 mm anechoic left upper pole renal mass most consistent with a cyst. No hydronephrosis visualized.  Abdominal aorta: No aneurysm visualized.  Other findings: None.  IMPRESSION: 1. No cholelithiasis or sonographic evidence of acute cholecystitis.   Electronically Signed   By: Elige KoHetal  Patel   On: 10/23/2014 15:02   Dg Chest Portable 1 View  10/23/2014   CLINICAL DATA:  Right-sided chest and back pain for 1 day  EXAM: PORTABLE CHEST - 1 VIEW  COMPARISON:  None.  FINDINGS: There is no edema or consolidation. The heart size and pulmonary vascularity are normal. No pneumothorax. No adenopathy. No bone lesions. There is slight mid thoracic dextroscoliosis.  IMPRESSION: No edema or consolidation.   Electronically  Signed   By: Bretta BangWilliam  Woodruff M.D.   On: 10/23/2014 12:40     EKG: see HPI  Physical Exam: Blood pressure 142/83, pulse 79, temperature 98.2 F (36.8 C), temperature source Oral, resp. rate 10, height 5\' 11"  (1.803 m), weight 82.555 kg (182 lb), SpO2 98 %. General: Well developed, well nourished, in no acute distress. Head: Normocephalic, atraumatic, sclera non-icteric, nares are without discharge Neck: Supple. Negative for carotid bruits. JVD not elevated. Lungs: Clear bilaterally to auscultation without wheezes, rales, or rhonchi. Breathing is unlabored. Heart: RRR with S1 S2. No murmurs, rubs, or gallops appreciated. Abdomen: Soft, non-tender, non-distended with normoactive bowel sounds. No rebound/guarding. No obvious abdominal masses. Msk:  Strength and tone appear normal for age. Extremities: No edema. No clubbing or cyanosis. Distal pedal pulses are 2+ and equal bilaterally. Neuro: Alert and oriented X 3. Moves all extremities spontaneously. Psych:  Responds to questions appropriately with a normal affect.   1. Chest pain and back pain, with dynamic EKG changes 2. Hyperlipidemia 3. Elevated TBili, nl abdominal U/S 4. Flank pain, normal U/A 5. Acutely elevated BP, reports  normally controlled 6. H/o allergic rhinitis, asthma, controlled on current meds  ASSESSMENT AND PLAN:  44 y.o. male w/ PMHx significant for allergic rhinitis but otherwise healthy who presented to [facility] on 10/23/2014 with complaints of chest, back and flank pain. His only risk factor appears to be hyperlipidemia though his blood pressure is acutely elevated to the 140s.   His story could be chalked up to benign causes (MSK from lifting, upset stomach and swallowing problems due to NSAID induced GI distress and mild pill esophagitis) but the chest pain with dynamic EKG changes associated with chest pain cannot so easily be explained away. Encouragingly, he is chest pain free now and his initial biomarkers  negative. Long discussion with patient, wife and father about the workup and implications of the tests performed thus far. At this time, he is still in the mod to high risk of acute coronary syndrome and workup could proceed with either a stress in the AM (if remains CP free and troponins negative) but I am leaning more towards catheterization given the dynamic changes of the EKG (would be helpful if able to pull MUSE ekg from 2009 which reportedly is more normal appearing but I am not able to do so).   Plan to observe overnight on telemetry with serial troponins. NPO at midnight for either exercise stress (on theophylline which excludes pharm stress) or more likely catheterization. Check lipids, re-start statin. Continue aspirin. Hold on BP agents for now unless continues to be elevated, recurrent symptoms, or +biomarker. Also, will further anti-coagulate for recurrent sxs, +biomarkers. Due to reports of back pain, will check d-dimer to exclude dissection which is low on my differential.  Flank pain has been evaluated with U/A and RUQ ultrasound which are both benign. Likely MSK in origin but if persists, further imaging may be necessary. 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  Full code. Ambulating (no need for DVT prophy) NPO for stress or cath.  Signed, Adolm Joseph, Troy Sine MD 10/23/2014, 7:19 PM

## 2014-10-24 ENCOUNTER — Encounter (HOSPITAL_COMMUNITY)
Admission: EM | Disposition: A | Discharge status: Other Acute Inpatient Hospital | Payer: Self-pay | Source: Home / Self Care | Attending: Emergency Medicine

## 2014-10-24 DIAGNOSIS — E785 Hyperlipidemia, unspecified: Secondary | ICD-10-CM

## 2014-10-24 DIAGNOSIS — R9431 Abnormal electrocardiogram [ECG] [EKG]: Secondary | ICD-10-CM | POA: Diagnosis not present

## 2014-10-24 DIAGNOSIS — R0789 Other chest pain: Secondary | ICD-10-CM | POA: Diagnosis not present

## 2014-10-24 DIAGNOSIS — R079 Chest pain, unspecified: Secondary | ICD-10-CM

## 2014-10-24 DIAGNOSIS — R1084 Generalized abdominal pain: Secondary | ICD-10-CM | POA: Diagnosis not present

## 2014-10-24 HISTORY — PX: LEFT HEART CATHETERIZATION WITH CORONARY ANGIOGRAM: SHX5451

## 2014-10-24 LAB — BASIC METABOLIC PANEL
ANION GAP: 9 (ref 5–15)
BUN: 12 mg/dL (ref 6–23)
CALCIUM: 8.9 mg/dL (ref 8.4–10.5)
CHLORIDE: 102 meq/L (ref 96–112)
CO2: 26 mmol/L (ref 19–32)
Creatinine, Ser: 0.87 mg/dL (ref 0.50–1.35)
GFR calc Af Amer: >90 mL/min (ref >90)
Glucose, Bld: 100 mg/dL — ABNORMAL HIGH (ref 70–99)
Potassium: 3.6 mmol/L (ref 3.5–5.1)
Sodium: 137 mmol/L (ref 135–145)

## 2014-10-24 LAB — TROPONIN I
Troponin I: <0.03 ng/mL (ref <0.031)
Troponin I: <0.03 ng/mL (ref <0.031)

## 2014-10-24 LAB — PROTIME-INR
INR: 1.09 (ref 0.00–1.49)
Prothrombin Time: 14.2 seconds (ref 11.6–15.2)

## 2014-10-24 LAB — LIPID PANEL
CHOL/HDL RATIO: 6.1 ratio
CHOLESTEROL: 249 mg/dL — AB (ref 0–200)
HDL: 41 mg/dL (ref >39)
LDL Cholesterol: 186 mg/dL — ABNORMAL HIGH (ref 0–99)
TRIGLYCERIDES: 112 mg/dL (ref <150)
VLDL: 22 mg/dL (ref 0–40)

## 2014-10-24 SURGERY — LEFT HEART CATHETERIZATION WITH CORONARY ANGIOGRAM

## 2014-10-24 MED ORDER — NITROGLYCERIN 1 MG/10 ML FOR IR/CATH LAB
INTRA_ARTERIAL | Status: AC
  Filled 2014-10-24: qty 10

## 2014-10-24 MED ORDER — ASPIRIN 81 MG PO CHEW: 81.0000 mg | CHEWABLE_TABLET | ORAL | Status: DC

## 2014-10-24 MED ORDER — LIDOCAINE HCL (PF) 1 % IJ SOLN
INTRAMUSCULAR | Status: AC
  Filled 2014-10-24: qty 30

## 2014-10-24 MED ORDER — MIDAZOLAM HCL 2 MG/2ML IJ SOLN
INTRAMUSCULAR | Status: AC
  Filled 2014-10-24: qty 2

## 2014-10-24 MED ORDER — SODIUM CHLORIDE 0.9 % IJ SOLN
3.0000 mL | Freq: Two times a day (BID) | INTRAMUSCULAR | Status: DC
  Administered 2014-10-24: 3 mL via INTRAVENOUS

## 2014-10-24 MED ORDER — SODIUM CHLORIDE 0.9 % IV SOLN
INTRAVENOUS | Status: AC
  Administered 2014-10-24: 15:00:00 via INTRAVENOUS

## 2014-10-24 MED ORDER — VERAPAMIL HCL 2.5 MG/ML IV SOLN
INTRAVENOUS | Status: AC
  Filled 2014-10-24: qty 2

## 2014-10-24 MED ORDER — SODIUM CHLORIDE 0.9 % IJ SOLN: 3.0000 mL | INTRAMUSCULAR | Status: DC | PRN

## 2014-10-24 MED ORDER — ATORVASTATIN CALCIUM 40 MG PO TABS: 40.0000 mg | ORAL_TABLET | Freq: Every day | ORAL | Status: DC

## 2014-10-24 MED ORDER — ONDANSETRON HCL 4 MG/2ML IJ SOLN: 4.0000 mg | Freq: Four times a day (QID) | INTRAMUSCULAR | Status: DC | PRN

## 2014-10-24 MED ORDER — MORPHINE SULFATE 2 MG/ML IJ SOLN: 2.0000 mg | INTRAMUSCULAR | Status: DC | PRN

## 2014-10-24 MED ORDER — ACETAMINOPHEN 325 MG PO TABS: 650.0000 mg | ORAL_TABLET | ORAL | Status: DC | PRN

## 2014-10-24 MED ORDER — HEPARIN (PORCINE) IN NACL 2-0.9 UNIT/ML-% IJ SOLN
INTRAMUSCULAR | Status: AC
  Filled 2014-10-24: qty 1000

## 2014-10-24 MED ORDER — SODIUM CHLORIDE 0.9 % IV SOLN
250.0000 mL | INTRAVENOUS | Status: DC
  Administered 2014-10-24: 250 mL via INTRAVENOUS

## 2014-10-24 MED ORDER — HEPARIN SODIUM (PORCINE) 1000 UNIT/ML IJ SOLN
INTRAMUSCULAR | Status: AC
  Filled 2014-10-24: qty 1

## 2014-10-24 MED ORDER — ASPIRIN 81 MG PO CHEW: 81.0000 mg | CHEWABLE_TABLET | Freq: Every day | ORAL | Status: DC

## 2014-10-24 NOTE — CV Procedure (Signed)
[name] ManlyJeffrey [name] is a 44 y.o. male    [id number] LOCATION:  FACILITY: [facility]  PHYSICIAN: Nanetta BattyJonathan Mccabe Gloria, M.D. 05/18/1971   DATE OF PROCEDURE:  10/24/2014  DATE OF DISCHARGE:     CARDIAC CATHETERIZATION     History obtained from chart review.[name] [name] is a 44 year old thin and fit-appearing married Caucasian male without prior cardiac history that was admitted last night with chest pain. This developed after working out at the gym having some back pain. Initially. He does have hyperlipidemia. He ruled out for myocardial infarction. His EKG showed nonspecific changes. He presents now for diagnostic coronary arteriography to define his anatomy and rule out an ischemic etiology.   PROCEDURE DESCRIPTION:   The patient was brought to the second floor [facility] cath lab in the postabsorptive state. He was premedicated with Versed IV. His right wristwas prepped and shaved in usual sterile fashion. Xylocaine 1% was used  for local anesthesia. A 6 French sheath was inserted into the right radial artery using standard Seldinger technique. The patient received 4500 units  of heparin  intravenously.  A 5 [place] TIG catheter and pigtail catheters were used for selective coronary angiography and left ventriculography respectively. Visipaque dye was used for the entirety of the case. Retrograde aortic, left ventricular and pullback pressures were recorded. A total of 65 mL of contrast was administered to the patient.    HEMODYNAMICS:    AO SYSTOLIC/AO DIASTOLIC: 145/92   LV SYSTOLIC/LV DIASTOLIC: 141/4  ANGIOGRAPHIC RESULTS:   1. Left main; normal  2. LAD; normal 3. Left circumflex; normal.  4. Right coronary artery; dominant and normal 5. Left ventriculography; RAO left ventriculogram was performed using  25 mL of Visipaque dye at 12 mL/second. The overall LVEF estimated  70 %  Without wall motion abnormalities  IMPRESSION:[name] [name] has normal coronary arteries and normal left  ventricular function. I believe this chest pain was noncardiac, slightly musculoskeletal and his EKG changes were nonischemic. Continue medical therapy will be recommended. The sheath was removed and a TR band was placed on the right breast to achieve patient hemostasis. The patient left the lab in stable condition. Discharged home later today with outpatient office follow-up.  Runell GessBERRY,Shalae Belmonte J. MD, [facility] 10/24/2014 2:23 PM

## 2014-10-24 NOTE — Discharge Summary (Signed)
Discharge instructions reviewed with patient and wife. Deny questions. Patient aware [facility] [address] Patient states this is an acceptable pharmacy for pickup. IV and telemetry discontinued. Patient's wife to bring car to main entrance where nurse tech will take patient via wheelchair for wife to drive him home.

## 2014-10-24 NOTE — Interval H&P Note (Signed)
Cath Lab Visit (complete for each Cath Lab visit)  Clinical Evaluation Leading to the Procedure:   ACS: Yes.    Non-ACS:    Anginal Classification: CCS IV  Anti-ischemic medical therapy: No Therapy  Non-Invasive Test Results: No non-invasive testing performed  Prior CABG: No previous CABG      History and Physical Interval Note:  10/24/2014 1:58 PM  [name] ManlyJeffrey Szymczak  has presented today for surgery, with the diagnosis of cp  The various methods of treatment have been discussed with the patient and family. After consideration of risks, benefits and other options for treatment, the patient has consented to  Procedure(s): LEFT HEART CATHETERIZATION WITH CORONARY ANGIOGRAM (N/A) as a surgical intervention .  The patient's history has been reviewed, patient examined, no change in status, stable for surgery.  I have reviewed the patient's chart and labs.  Questions were answered to the patient's satisfaction.     Runell GessBERRY,JONATHAN J

## 2014-10-24 NOTE — Discharge Summary (Signed)
Physician Discharge Summary     Cardiologist:  Turner(New) Patient ID: [name] [name] MRN: [id number] DOB/AGE: 45/12/1970 45 y.o.  Admit date: 10/23/2014 Discharge date: 10/24/2014  Admission Diagnoses:  Unstable angina  Discharge Diagnoses:  Active Problems:   Chest pain   Unstable angina   Abnormal EKG   HLD   Elevated Tbili   Essential HTN  Discharged Condition: stable  Hospital Course:   44 y.o. male w/ PMHx significant for allergic rhinitis but otherwise healthy who presented to [facility] on 10/23/2014 with complaints of chest, back and flank pain. 1 week ago, was at normal healthy baseline (no cardiac history, only risk factor is hyperlipidemia, previously on statin but now off) and lifted weights on Wednesday (squats, legs). Nothing too extreme but nonetheless, the next day, felt muscle pain in his back and flanks that he attributed to lifting. Took some ibuprofen on Friday and had the onset of some mild odynophagia (?pill got stuck sensation), and general sensation of feeling unwell (did not want to go out and eat with family). Chest pressure was first experienced on Saturday. Middle of chest. No associated with exertion or other symptoms. At other times, had heartburn like symptoms (acid feeling, burping) but chest pressure was different. At different time, had pain between his shoulder blades. Also with R flank pain. No nausea or vomiting. No difficulty urinating, blood in urine, in stool etc. Bowels normal. Had chest pain today when he sought care at the [facility]. No response to nitro (just made him lightheaded) but chest pain resolved on its own. None now.  Workup including U/A was negative for blood. Abd ultrasound negative for gallbladder dz, aortic enlargement. Mention of L up pole renal spot, most likely cyst. Cxray is benign. Troponin negative. Elevated TBili (elevated prior, ?Gilbert's).  EKG is NSR with inferolateral ST flattening/depression (reportedly had EKG in  2009 but I am unable to access MUSE). Repeat done now demonstrates less flattening. Cxray negative for CV abnormalities.  He was admitted for observation.  He ruled out for MI.  Started on statin which will be continued at DC.  LDL 186.  He underwent left heart cath which revealed normal coronary arteries and LV function.  Greater than 30 minutes was spent completing the patient's discharge. The patient was seen by Dr. Mendel Corninguner who felt he was stable for DC home.  Will arrange follow up.    Check LFTs in 6 weeks.    Consults: None  Significant Diagnostic Studies:  CARDIAC CATHETERIZATION     History obtained from chart review.[name] [name] is a 44 year old thin and fit-appearing married Caucasian male without prior cardiac history that was admitted last night with chest pain. This developed after working out at the gym having some back pain. Initially. He does have hyperlipidemia. He ruled out for myocardial infarction. His EKG showed nonspecific changes. He presents now for diagnostic coronary arteriography to define his anatomy and rule out an ischemic etiology.   PROCEDURE DESCRIPTION:   The patient was brought to the second floor [facility] cath lab in the postabsorptive state. He was premedicated with Versed IV. His right wristwas prepped and shaved in usual sterile fashion. Xylocaine 1% was used  for local anesthesia. A 6 French sheath was inserted into the right radial artery using standard Seldinger technique. The patient received 4500 units of heparin intravenously. A 5 [place] TIG catheter and pigtail catheters were used for selective coronary angiography and left ventriculography respectively. Visipaque dye was used for the entirety  of the case. Retrograde aortic, left ventricular and pullback pressures were recorded. A total of 65 mL of contrast was administered to the patient.    HEMODYNAMICS:   AO SYSTOLIC/AO DIASTOLIC: 145/92  LV SYSTOLIC/LV DIASTOLIC:  141/4  ANGIOGRAPHIC RESULTS:   1. Left main; normal  2. LAD; normal 3. Left circumflex; normal.  4. Right coronary artery; dominant and normal 5. Left ventriculography; RAO left ventriculogram was performed using  25 mL of Visipaque dye at 12 mL/second. The overall LVEF estimated  70 % Without wall motion abnormalities  IMPRESSION:[name] [name] has normal coronary arteries and normal left ventricular function. I believe this chest pain was noncardiac, slightly musculoskeletal and his EKG changes were nonischemic. Continue medical therapy will be recommended. The sheath was removed and a TR band was placed on the right breast to achieve patient hemostasis. The patient left the lab in stable condition. Discharged home later today with outpatient office follow-up.  Runell Gess MD, [facility] 10/24/2014  ULTRASOUND ABDOMEN COMPLETE  COMPARISON: None.  FINDINGS: Gallbladder: No gallstones or wall thickening visualized. No sonographic Murphy sign noted. Common bile duct: Diameter: 4 mm Liver: No focal lesion identified. Within normal limits in parenchymal echogenicity. IVC: No abnormality visualized. Pancreas: Visualized portion unremarkable. Spleen: Size and appearance within normal limits. Right Kidney: Length: 11.2 cm. Echogenicity within normal limits. No mass or hydronephrosis visualized. Left Kidney: Length: 12.1 cm. Echogenicity within normal limits. There is a 8 mm anechoic left upper pole renal mass most consistent with a cyst. No hydronephrosis visualized. Abdominal aorta: No aneurysm visualized. Other findings: None.  IMPRESSION: 1. No cholelithiasis or sonographic evidence of acute cholecystitis.   Electronically Signed  By: Elige Ko Lipid Panel     Component Value Date/Time   CHOL 249* 10/24/2014 0357   TRIG 112 10/24/2014 0357   HDL 41 10/24/2014 0357   CHOLHDL 6.1 10/24/2014 0357   VLDL 22 10/24/2014 0357   LDLCALC 186* 10/24/2014  0357     Treatments: See above  Discharge Exam: Blood pressure 113/65, pulse 76, temperature 99.4 F (37.4 C), temperature source Oral, resp. rate 18, height  (1.803 m), weight 181 lb 8 oz (82.328 kg), SpO2 97 %.   Disposition: Final discharge disposition not confirmed      Discharge Instructions    Diet - low sodium heart healthy    Complete by:  As directed      Discharge instructions    Complete by:  As directed   No lifting with right arm for three days.     Increase activity slowly    Complete by:  As directed             Medication List    TAKE these medications        acetaminophen 500 MG tablet  Commonly known as:  TYLENOL  Take 1,000 mg by mouth daily as needed for headache.     albuterol 108 (90 BASE) MCG/ACT inhaler  Commonly known as:  PROAIR HFA  2 puffs every 4 hours if needed, rescue inhaler     atorvastatin 40 MG tablet  Commonly known as:  LIPITOR  Take 1 tablet (40 mg total) by mouth daily at 6 PM.     fluticasone 50 MCG/ACT nasal spray  Commonly known as:  FLONASE  Place 2 sprays into both nostrils daily.     ibuprofen 200 MG tablet  Commonly known as:  ADVIL,MOTRIN  Take 200 mg by mouth 2 (two) times daily as needed (pain).  loratadine 10 MG tablet  Commonly known as:  CLARITIN  Take 10 mg by mouth daily as needed for allergies.     theophylline 300 MG 12 hr tablet  Commonly known as:  THEODUR  Take 1 tablet (300 mg total) by mouth 2 (two) times daily. With food       Follow-up Information    Follow up with Quintella Reichert, MD.   Specialty:  Cardiology   Why:  The office will call with the follow up appt date and time.    Contact information:   [address] [place] [address] [phone]      Greater than 30 minutes was spent completing the patient's discharge.   SignedWilburt Finlay, PAC 10/24/2014, 6:12 PM

## 2014-10-24 NOTE — Progress Notes (Signed)
Subjective: No furhter chest pain  Objective: Vital signs in last 24 hours: Temp:  [98.2 F (36.8 C)-99.1 F (37.3 C)] 98.3 F (36.8 C) (01/11 0630) Pulse Rate:  [69-94] 71 (01/11 0630) Resp:  [10-21] 18 (01/11 0630) BP: (123-151)/(75-89) 126/79 mmHg (01/11 0630) SpO2:  [94 %-100 %] 99 % (01/11 0630) Weight:  [181 lb 8 oz (82.328 kg)-182 lb (82.555 kg)] 181 lb 8 oz (82.328 kg) (01/11 0630) Weight change:    Intake/Output from previous day:   Intake/Output this shift:    PE: General:Pleasant affect, NAD Skin:Warm and dry, brisk capillary refill HEENT:normocephalic, sclera clear, mucus membranes moist Heart:S1S2 RRR without murmur, gallup, rub or click Lungs:clear without rales, rhonchi, or wheezes ZOX:WRUE, mild epigastric tenderness, + BS, do not palpate liver spleen or masses Ext:no lower ext edema, 2+ pedal pulses, 2+ radial pulses Neuro:alert and oriented, MAE, follows commands, + facial symmetry   Lab Results:  Recent Labs  10/23/14 1115  WBC 8.3  HGB 15.5  HCT 46.8  PLT 285   BMET  Recent Labs  10/23/14 1115 10/24/14 0357  NA 136 137  K 3.7 3.6  CL 101 102  CO2 28 26  GLUCOSE 97 100*  BUN 15 12  CREATININE 0.93 0.87  CALCIUM 9.4 8.9    Recent Labs  10/23/14 2223 10/24/14 0357  TROPONINI <0.03 <0.03    Lab Results  Component Value Date   CHOL 249* 10/24/2014   HDL 41 10/24/2014   LDLCALC 186* 10/24/2014   TRIG 112 10/24/2014   CHOLHDL 6.1 10/24/2014   No results found for: HGBA1C   No results found for: TSH  Hepatic Function Panel  Recent Labs  10/23/14 1115  PROT 8.0  ALBUMIN 4.2  AST 22  ALT 19  ALKPHOS 69  BILITOT 1.7*    Recent Labs  10/24/14 0357  CHOL 249*   No results for input(s): PROTIME in the last 72 hours.     Studies/Results: US Abdomen Complete  10/23/2014   CLINICAL DATA:  Right flank pain, mid chest pain  EXAM: ULTRASOUND ABDOMEN COMPLETE  COMPARISON:  None.  FINDINGS: Gallbladder: No  gallstones or wall thickening visualized. No sonographic Murphy sign noted.  Common bile duct: Diameter: 4 mm  Liver: No focal lesion identified. Within normal limits in parenchymal echogenicity.  IVC: No abnormality visualized.  Pancreas: Visualized portion unremarkable.  Spleen: Size and appearance within normal limits.  Right Kidney: Length: 11.2 cm. Echogenicity within normal limits. No mass or hydronephrosis visualized.  Left Kidney: Length: 12.1 cm. Echogenicity within normal limits. There is a 8 mm anechoic left upper pole renal mass most consistent with a cyst. No hydronephrosis visualized.  Abdominal aorta: No aneurysm visualized.  Other findings: None.  IMPRESSION: 1. No cholelithiasis or sonographic evidence of acute cholecystitis.   Electronically Signed   By: Elige Ko   On: 10/23/2014 15:02   Dg Chest Portable 1 View  10/23/2014   CLINICAL DATA:  Right-sided chest and back pain for 1 day  EXAM: PORTABLE CHEST - 1 VIEW  COMPARISON:  None.  FINDINGS: There is no edema or consolidation. The heart size and pulmonary vascularity are normal. No pneumothorax. No adenopathy. No bone lesions. There is slight mid thoracic dextroscoliosis.  IMPRESSION: No edema or consolidation.   Electronically Signed   By: Bretta Bang M.D.   On: 10/23/2014 12:40    Medications: I have reviewed the patient's current medications. Scheduled Meds: . aspirin  EC  81 mg Oral Daily  . atorvastatin  40 mg Oral q1800  . fluticasone  2 spray Each Nare Daily  . sodium chloride  3 mL Intravenous Q12H  . theophylline  300 mg Oral BID   Continuous Infusions:  PRN Meds:.sodium chloride, acetaminophen, nitroGLYCERIN, sodium chloride  Assessment/Plan:44 y.o. male w/ PMHx significant for allergic rhinitis but otherwise healthy who presented to [facility] on 10/23/2014 with complaints of chest, back and flank pain.  Chest pressure was first experienced on Saturday. Middle of chest. No associated with exertion or other  symptoms. At other times, had heartburn like symptoms (acid feeling, burping) but chest pressure was different. At different time, had pain between his shoulder blades. Also with R flank pain. No nausea or vomiting. No difficulty urinating, blood in urine, in stool etc. Bowels normal. Had chest pain today when he sought care at the [facility]. No response to nitro (just made him lightheaded) but chest pain resolved on its own. His EKG is abnormal.  Troponins are negative   LDL is 186--EKG is NSR with inferolateral ST flattening/depression (reportedly had EKG in 2009 but I am unable to access MUSE). Repeat done now demonstrates less flattening.   1. Chest pain and back pain, with dynamic EKG changes, improved on follow up tracings, abd ultrasound is negative discussed with Dr. Balinda Quailsruner, plan for cardiac cath today, pt has been NPO 2. Hyperlipidemia- now on lipitor 40  3. Elevated TBili, nl abdominal U/S 4. Flank pain, normal U/A 5. Acutely elevated BP, reports normally controlled 6. H/o allergic rhinitis, asthma, controlled on current meds- theophylline    LOS: 1 day   Time spent with pt. :15 minutes. [facility] Lakes Medical CenterNGOLD,Fares Ramthun R  Nurse Practitioner Certified Pager [phone] or after 5pm and on weekends call [phone] 10/24/2014, 8:12 AM

## 2014-10-24 NOTE — H&P (View-Only) (Signed)
       Subjective: No furhter chest pain  Objective: Vital signs in last 24 hours: Temp:  [98.2 F (36.8 C)-99.1 F (37.3 C)] 98.3 F (36.8 C) (01/11 0630) Pulse Rate:  [69-94] 71 (01/11 0630) Resp:  [10-21] 18 (01/11 0630) BP: (123-151)/(75-89) 126/79 mmHg (01/11 0630) SpO2:  [94 %-100 %] 99 % (01/11 0630) Weight:  [181 lb 8 oz (82.328 kg)-182 lb (82.555 kg)] 181 lb 8 oz (82.328 kg) (01/11 0630) Weight change:    Intake/Output from previous day:   Intake/Output this shift:    PE: General:Pleasant affect, NAD Skin:Warm and dry, brisk capillary refill HEENT:normocephalic, sclera clear, mucus membranes moist Heart:S1S2 RRR without murmur, gallup, rub or click Lungs:clear without rales, rhonchi, or wheezes Abd:soft, mild epigastric tenderness, + BS, do not palpate liver spleen or masses Ext:no lower ext edema, 2+ pedal pulses, 2+ radial pulses Neuro:alert and oriented, MAE, follows commands, + facial symmetry   Lab Results:  Recent Labs  10/23/14 1115  WBC 8.3  HGB 15.5  HCT 46.8  PLT 285   BMET  Recent Labs  10/23/14 1115 10/24/14 0357  NA 136 137  K 3.7 3.6  CL 101 102  CO2 28 26  GLUCOSE 97 100*  BUN 15 12  CREATININE 0.93 0.87  CALCIUM 9.4 8.9    Recent Labs  10/23/14 2223 10/24/14 0357  TROPONINI <0.03 <0.03    Lab Results  Component Value Date   CHOL 249* 10/24/2014   HDL 41 10/24/2014   LDLCALC 186* 10/24/2014   TRIG 112 10/24/2014   CHOLHDL 6.1 10/24/2014   No results found for: HGBA1C   No results found for: TSH  Hepatic Function Panel  Recent Labs  10/23/14 1115  PROT 8.0  ALBUMIN 4.2  AST 22  ALT 19  ALKPHOS 69  BILITOT 1.7*    Recent Labs  10/24/14 0357  CHOL 249*   No results for input(s): PROTIME in the last 72 hours.     Studies/Results: Us Abdomen Complete  10/23/2014   CLINICAL DATA:  Right flank pain, mid chest pain  EXAM: ULTRASOUND ABDOMEN COMPLETE  COMPARISON:  None.  FINDINGS: Gallbladder: No  gallstones or wall thickening visualized. No sonographic Murphy sign noted.  Common bile duct: Diameter: 4 mm  Liver: No focal lesion identified. Within normal limits in parenchymal echogenicity.  IVC: No abnormality visualized.  Pancreas: Visualized portion unremarkable.  Spleen: Size and appearance within normal limits.  Right Kidney: Length: 11.2 cm. Echogenicity within normal limits. No mass or hydronephrosis visualized.  Left Kidney: Length: 12.1 cm. Echogenicity within normal limits. There is a 8 mm anechoic left upper pole renal mass most consistent with a cyst. No hydronephrosis visualized.  Abdominal aorta: No aneurysm visualized.  Other findings: None.  IMPRESSION: 1. No cholelithiasis or sonographic evidence of acute cholecystitis.   Electronically Signed   By: Hetal  Patel   On: 10/23/2014 15:02   Dg Chest Portable 1 View  10/23/2014   CLINICAL DATA:  Right-sided chest and back pain for 1 day  EXAM: PORTABLE CHEST - 1 VIEW  COMPARISON:  None.  FINDINGS: There is no edema or consolidation. The heart size and pulmonary vascularity are normal. No pneumothorax. No adenopathy. No bone lesions. There is slight mid thoracic dextroscoliosis.  IMPRESSION: No edema or consolidation.   Electronically Signed   By: William  Woodruff M.D.   On: 10/23/2014 12:40    Medications: I have reviewed the patient's current medications. Scheduled Meds: . aspirin   EC  81 mg Oral Daily  . atorvastatin  40 mg Oral q1800  . fluticasone  2 spray Each Nare Daily  . sodium chloride  3 mL Intravenous Q12H  . theophylline  300 mg Oral BID   Continuous Infusions:  PRN Meds:.sodium chloride, acetaminophen, nitroGLYCERIN, sodium chloride  Assessment/Plan:44 y.o. male w/ PMHx significant for allergic rhinitis but otherwise healthy who presented to [facility] on 10/23/2014 with complaints of chest, back and flank pain.  Chest pressure was first experienced on Saturday. Middle of chest. No associated with exertion or other  symptoms. At other times, had heartburn like symptoms (acid feeling, burping) but chest pressure was different. At different time, had pain between his shoulder blades. Also with R flank pain. No nausea or vomiting. No difficulty urinating, blood in urine, in stool etc. Bowels normal. Had chest pain today when he sought care at the [facility]. No response to nitro (just made him lightheaded) but chest pain resolved on its own. His EKG is abnormal.  Troponins are negative   LDL is 186--EKG is NSR with inferolateral ST flattening/depression (reportedly had EKG in 2009 but I am unable to access MUSE). Repeat done now demonstrates less flattening.   1. Chest pain and back pain, with dynamic EKG changes, improved on follow up tracings, abd ultrasound is negative discussed with Dr. Truner, plan for cardiac cath today, pt has been NPO 2. Hyperlipidemia- now on lipitor 40  3. Elevated TBili, nl abdominal U/S 4. Flank pain, normal U/A 5. Acutely elevated BP, reports normally controlled 6. H/o allergic rhinitis, asthma, controlled on current meds- theophylline    LOS: 1 day   Time spent with pt. :15 minutes. Octavious Zidek R  Nurse Practitioner Certified Pager [phone] or after 5pm and on weekends call [phone] 10/24/2014, 8:12 AM    

## 2014-10-24 NOTE — Progress Notes (Signed)
UR completed 

## 2014-10-24 NOTE — Plan of Care (Signed)
Problem: Phase II Progression Outcomes Goal: Pain controlled with appropriate interventions Outcome: Not Applicable Date Met:  45/62/56 Patient denies pain

## 2014-10-25 ENCOUNTER — Encounter (HOSPITAL_COMMUNITY): Payer: Self-pay | Admitting: Cardiovascular Disease

## 2014-11-17 ENCOUNTER — Encounter: Payer: BLUE CROSS/BLUE SHIELD | Admitting: Cardiology

## 2015-04-11 ENCOUNTER — Other Ambulatory Visit: Payer: Self-pay | Admitting: Internal Medicine

## 2015-05-01 ENCOUNTER — Encounter: Payer: Self-pay | Admitting: Internal Medicine

## 2015-05-01 ENCOUNTER — Ambulatory Visit: Payer: BLUE CROSS/BLUE SHIELD | Admitting: Internal Medicine

## 2015-05-01 VITALS — BP 110/80 | HR 74 | Ht 70.0 in | Wt 185.0 lb

## 2015-05-01 DIAGNOSIS — J45998 Other asthma: Secondary | ICD-10-CM

## 2015-05-01 MED ORDER — ALBUTEROL SULFATE HFA 108 (90 BASE) MCG/ACT IN AERS: 2.0000 | INHALATION_SPRAY | RESPIRATORY_TRACT | Status: DC | PRN

## 2015-05-01 MED ORDER — THEOPHYLLINE ER 300 MG PO TB12: ORAL_TABLET | ORAL | Status: DC

## 2015-05-01 MED ORDER — FLUTICASONE PROPIONATE 50 MCG/ACT NA SUSP: 2.0000 | Freq: Every day | NASAL | Status: DC

## 2015-05-01 NOTE — Progress Notes (Signed)
Subjective:    Patient ID: [name] [name], male    DOB: 1971/02/19, 44 y.o.   MRN: [id number]  HPI 04/09/11- 39 yoM never smoker, followed for Asthma, allergic rhinitis with hx sinusitis Last here January 01, 2010- note reviewed No longer notices chest tightness after mowing. He expects about one asthma flare/ year, usually from a cold. Still uses theophylline and thinks he is fine with his meds. We discussed more widely used alternatives, such as steroid inhalers, and reviewed common side effects of theophylline. He is satisfied to stay with this for now. Uses Proair only every 3-4 months. Uses flonase every day w/o nose bleeds, with rare claritin.   04/08/12- 39 yoM never smoker, followed for Asthma, allergic rhinitis with hx sinusitis Reports doing well with no complaints.  Good year with no special problems. Continues daily steroid nasal spray with no epistaxis. Still using theophylline which he finds helpful and well tolerated. If he misses a dose of theophylline his chest gets tight. Rare need .for rescue inhaler  04/28/13- 41 yoM never smoker, followed for Asthma, allergic rhinitis with hx sinusitis FOLLOWS FOR: yearly follow up; reports breathing is doing well.  no new complaints. Likes theophylline. Doing well. Meds discussed  09/16/13- 42 yoM never smoker, followed for Asthma, allergic rhinitis with hx sinusitis ACUTE VISIT:  Congestion in head and chest x2 weeks and worsening.  Today is last day of Z-Pak and taper of pred. Was on prednisone for poison oak and added Z-Pak. Chest tight without wheeze. Mild short of breath. Some green. Retro-orbital headache. Temperature 99.7.  04/28/14- 42 yoM never smoker, followed for Asthma, allergic rhinitis with hx sinusitis FOLLOWS FOR: No flare ups since last visit and doing well. No problems with spring pollen season this year. Still using theophylline 300 mg twice daily and does not want to give that up. Medication talk done.  718/16- 54 yoM never  smoker, followed for Asthma, allergic rhinitis with hx sinusitis Follows For: No flare ups since last visit. States he is doing well.      ROS-see HPI Constitutional:   No-   weight loss, night sweats, fevers, chills, fatigue, lassitude. HEENT:   +headaches, difficulty swallowing, tooth/dental problems, sore throat,       No-  sneezing, itching, ear ache, +nasal congestion, post nasal drip,  CV:  No-   chest pain, orthopnea, PND, swelling in lower extremities, anasarca,  dizziness, palpitations Resp: No-   shortness of breath with exertion or at rest.             No-productive cough,  No non-productive cough,  No- coughing up of blood.              No-   change in color of mucus.  No- wheezing.   Skin: No-   rash or lesions. GI:  No-   heartburn, indigestion, abdominal pain, nausea, vomiting,  GU:  MS:  No-   joint pain or swelling.  Neuro-     nothing unusual Psych:  No- change in mood or affect. No depression or anxiety.  No memory loss.  OBJ- Physical Exam looks well General- Alert, Oriented, Affect-appropriate, Distress- none acute. Medium build Skin- rash-none, lesions- none, excoriation- none Lymphadenopathy- none Head- atraumatic            Eyes- Gross vision intact, PERRLA, conjunctivae and secretions clear            Ears- Hearing, canals-normal  Nose-  no-Septal dev, mucus-none, no-polyps, erosion, perforation             Throat- Mallampati III , mucosa clear , drainage- none, tonsils- atrophic Neck- flexible , trachea midline, no stridor , thyroid nl, carotid no bruit Chest - symmetrical excursion , unlabored           Heart/CV- RRR , no murmur , no gallop  , no rub, nl s1 s2                           - JVD- none , edema- none, stasis changes- none, varices- none           Lung- clear to P&A, wheeze- none, cough- none , dullness-none, rub- none           Chest wall-  Abd-  Br/ Gen/ Rectal- Not done, not indicated Extrem- cyanosis- none, clubbing, none,  atrophy- none, strength- nl Neuro- grossly intact to observation

## 2015-05-01 NOTE — Patient Instructions (Signed)
Meds refilled to continue as you are doing  Please call as needed

## 2015-06-28 ENCOUNTER — Telehealth: Payer: Self-pay | Admitting: Internal Medicine

## 2015-06-28 NOTE — Telephone Encounter (Signed)
Pharmacy notified.  Nothing further needed.

## 2015-06-28 NOTE — Telephone Encounter (Signed)
Ok to [employer] laboratory technician as requested

## 2015-06-28 NOTE — Telephone Encounter (Signed)
Received a call from [facility] regarding Theophylline.  They have a manufacturer change and are requesting approval to fill Theophylline. Call [phone], Reference No. [id number]  Ok to refill Theophylline with manufacturer changed?   Dr. Maple Hudson, please advise.

## 2015-07-12 ENCOUNTER — Telehealth: Payer: Self-pay | Admitting: Internal Medicine

## 2015-07-12 NOTE — Telephone Encounter (Signed)
Tried to call pt to inform him of the manufacturer switch on his medication. Not available.   Spoke with pharmacy and gave the ok to switch per CY. Will await pt to return call to inform him.

## 2015-07-12 NOTE — Telephone Encounter (Signed)
Spoke with mack at [facility] called to inform us that the manufacturer that they use for pt's Theophylline which is [employer] is on back order and the one they have available is by [employer]. They state that the inactive ingredients are slightly different and want to confirm it is ok to switch manufacturers. Please advise if you are okay with this. Thanks.

## 2015-07-12 NOTE — Telephone Encounter (Signed)
Ok to Journalist, newspaper. Please let patient know about this.

## 2015-07-13 NOTE — Telephone Encounter (Signed)
LMTCB

## 2015-07-14 NOTE — Telephone Encounter (Signed)
lmomtcb x 2  

## 2015-07-17 NOTE — Telephone Encounter (Signed)
lmtcb for pt.  

## 2015-07-18 NOTE — Telephone Encounter (Signed)
lmtcb for pt.  

## 2015-09-13 ENCOUNTER — Other Ambulatory Visit: Payer: Self-pay | Admitting: *Deleted

## 2015-09-13 MED ORDER — THEOPHYLLINE ER 300 MG PO TB12: ORAL_TABLET | ORAL | Status: DC

## 2016-04-30 ENCOUNTER — Ambulatory Visit: Payer: BLUE CROSS/BLUE SHIELD | Admitting: Internal Medicine

## 2016-05-03 ENCOUNTER — Encounter: Payer: Self-pay | Admitting: Internal Medicine

## 2016-05-03 ENCOUNTER — Ambulatory Visit (INDEPENDENT_AMBULATORY_CARE_PROVIDER_SITE_OTHER): Payer: BLUE CROSS/BLUE SHIELD | Admitting: Internal Medicine

## 2016-05-03 VITALS — BP 118/72 | HR 67 | Ht 70.0 in | Wt 174.2 lb

## 2016-05-03 DIAGNOSIS — J45998 Other asthma: Secondary | ICD-10-CM | POA: Diagnosis not present

## 2016-05-03 DIAGNOSIS — J302 Other seasonal allergic rhinitis: Secondary | ICD-10-CM

## 2016-05-03 DIAGNOSIS — J309 Allergic rhinitis, unspecified: Secondary | ICD-10-CM

## 2016-05-03 DIAGNOSIS — J3089 Other allergic rhinitis: Principal | ICD-10-CM

## 2016-05-03 MED ORDER — ALBUTEROL SULFATE HFA 108 (90 BASE) MCG/ACT IN AERS: 2.0000 | INHALATION_SPRAY | RESPIRATORY_TRACT | Status: DC | PRN

## 2016-05-03 MED ORDER — FLUTICASONE PROPIONATE 50 MCG/ACT NA SUSP: 2.0000 | Freq: Every day | NASAL | Status: DC

## 2016-05-03 MED ORDER — THEOPHYLLINE ER 300 MG PO TB12: ORAL_TABLET | ORAL | Status: DC

## 2016-05-03 NOTE — Patient Instructions (Signed)
3 month refillable med refill scripts sent  You have done well, so it should work well for you to follow with Dr Waynard EdwardsPerini to manage your mild intermittent asthma and refill your meds in the future.   We will be happy to see you here again if needed  \

## 2016-05-03 NOTE — Assessment & Plan Note (Signed)
He remains quite satisfied with his current medications including theophylline which I discussed as a less commonly used medication now. We usually favor a maintenance controller inhaler if needed. He does not want to change and denies any heartburn or palpitation. He is stable enough now as I begin to slow down, so I recommended that he follow with his primary physician for this problem and refills in the future as needed. He is certainly welcome back to this office if needed.

## 2016-05-03 NOTE — Assessment & Plan Note (Signed)
He denies any significant problems with rhinitis to this recent spring pollen season and no significant sinusitis last winter.

## 2016-05-03 NOTE — Progress Notes (Signed)
Subjective:    Patient ID: [name] [name], male    DOB: 02/04/1971, 45 y.o.   MRN: [id number]  HPI 04/09/11- 39 yoM never smoker, followed for Asthma, allergic rhinitis with hx sinusitis Last here January 01, 2010- note reviewed No longer notices chest tightness after mowing. He expects about one asthma flare/ year, usually from a cold. Still uses theophylline and thinks he is fine with his meds. We discussed more widely used alternatives, such as steroid inhalers, and reviewed common side effects of theophylline. He is satisfied to stay with this for now. Uses Proair only every 3-4 months. Uses flonase every day w/o nose bleeds, with rare claritin.   04/08/12- 39 yoM never smoker, followed for Asthma, allergic rhinitis with hx sinusitis Reports doing well with no complaints.  Good year with no special problems. Continues daily steroid nasal spray with no epistaxis. Still using theophylline which he finds helpful and well tolerated. If he misses a dose of theophylline his chest gets tight. Rare need .for rescue inhaler  04/28/13- 41 yoM never smoker, followed for Asthma, allergic rhinitis with hx sinusitis FOLLOWS FOR: yearly follow up; reports breathing is doing well.  no new complaints. Likes theophylline. Doing well. Meds discussed  09/16/13- 42 yoM never smoker, followed for Asthma, allergic rhinitis with hx sinusitis ACUTE VISIT:  Congestion in head and chest x2 weeks and worsening.  Today is last day of Z-Pak and taper of pred. Was on prednisone for poison oak and added Z-Pak. Chest tight without wheeze. Mild short of breath. Some green. Retro-orbital headache. Temperature 99.7.  04/28/14- 42 yoM never smoker, followed for Asthma, allergic rhinitis with hx sinusitis FOLLOWS FOR: No flare ups since last visit and doing well. No problems with spring pollen season this year. Still using theophylline 300 mg twice daily and does not want to give that up. Medication talk done.  718/16- [age] yoM never  smoker, followed for Asthma, allergic rhinitis with hx sinusitis Follows For: No flare ups since last visit. States he is doing well.   05/03/2016-45 year old male never smoker followed for Asthma, allergic rhinitis with history sinusitis FOLLOWS FOR: Pt denies any troubles with allergies or breathing at this time. He continues very stable and denies any significant exacerbation of asthma or rhinitis symptoms. He still believes theophylline works best for him although it is not a commonly used medication any longer as discussed.  ROS-see HPI Constitutional:   No-   weight loss, night sweats, fevers, chills, fatigue, lassitude. HEENT:   +headaches, difficulty swallowing, tooth/dental problems, sore throat,       No-  sneezing, itching, ear ache, +nasal congestion, post nasal drip,  CV:  No-   chest pain, orthopnea, PND, swelling in lower extremities, anasarca,  dizziness, palpitations Resp: No-   shortness of breath with exertion or at rest.             No-productive cough,  No non-productive cough,  No- coughing up of blood.              No-   change in color of mucus.  No- wheezing.   Skin: No-   rash or lesions. GI:  No-   heartburn, indigestion, abdominal pain, nausea, vomiting,  GU:  MS:  No-   joint pain or swelling.  Neuro-     nothing unusual Psych:  No- change in mood or affect. No depression or anxiety.  No memory loss.  OBJ- Physical Exam looks well General- Alert, Oriented, Affect-appropriate, Distress- none acute.  Medium build Skin- rash-none, lesions- none, excoriation- none Lymphadenopathy- none Head- atraumatic            Eyes- Gross vision intact, PERRLA, conjunctivae and secretions clear            Ears- Hearing, canals-normal            Nose-  no-Septal dev, mucus-none, no-polyps, erosion, perforation             Throat- Mallampati III , mucosa clear , drainage- none, tonsils- atrophic Neck- flexible , trachea midline, no stridor , thyroid nl, carotid no bruit Chest  - symmetrical excursion , unlabored           Heart/CV- RRR , no murmur , no gallop  , no rub, nl s1 s2                           - JVD- none , edema- none, stasis changes- none, varices- none           Lung- clear to P&A, wheeze- none, cough- none , dullness-none, rub- none           Chest wall-  Abd-  Br/ Gen/ Rectal- Not done, not indicated Extrem- cyanosis- none, clubbing, none, atrophy- none, strength- nl Neuro- grossly intact to observation

## 2017-04-02 ENCOUNTER — Emergency Department (HOSPITAL_BASED_OUTPATIENT_CLINIC_OR_DEPARTMENT_OTHER): Payer: BLUE CROSS/BLUE SHIELD

## 2017-04-02 ENCOUNTER — Encounter (HOSPITAL_BASED_OUTPATIENT_CLINIC_OR_DEPARTMENT_OTHER): Payer: Self-pay | Admitting: *Deleted

## 2017-04-02 ENCOUNTER — Emergency Department (HOSPITAL_BASED_OUTPATIENT_CLINIC_OR_DEPARTMENT_OTHER)
Admission: EM | Admit: 2017-04-02 | Discharge: 2017-04-02 | Disposition: A | Discharge status: Home / Self Care | Payer: BLUE CROSS/BLUE SHIELD | Attending: Emergency Medicine | Admitting: Emergency Medicine

## 2017-04-02 DIAGNOSIS — Y9355 Activity, bike riding: Secondary | ICD-10-CM | POA: Diagnosis not present

## 2017-04-02 DIAGNOSIS — S2232XA Fracture of one rib, left side, initial encounter for closed fracture: Secondary | ICD-10-CM

## 2017-04-02 DIAGNOSIS — Y929 Unspecified place or not applicable: Secondary | ICD-10-CM | POA: Diagnosis not present

## 2017-04-02 DIAGNOSIS — Z791 Long term (current) use of non-steroidal anti-inflammatories (NSAID): Secondary | ICD-10-CM | POA: Diagnosis not present

## 2017-04-02 DIAGNOSIS — Z79899 Other long term (current) drug therapy: Secondary | ICD-10-CM | POA: Insufficient documentation

## 2017-04-02 DIAGNOSIS — R079 Chest pain, unspecified: Secondary | ICD-10-CM | POA: Diagnosis present

## 2017-04-02 DIAGNOSIS — Y999 Unspecified external cause status: Secondary | ICD-10-CM | POA: Insufficient documentation
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
## 2017-04-02 NOTE — ED Notes (Signed)
Patient transported to X-ray 
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
## 2017-06-08 ENCOUNTER — Other Ambulatory Visit: Payer: Self-pay | Admitting: Internal Medicine

## 2017-08-19 ENCOUNTER — Telehealth: Payer: Self-pay | Admitting: Internal Medicine

## 2017-08-19 NOTE — Telephone Encounter (Signed)
Left message for patient to call back  

## 2017-08-19 NOTE — Telephone Encounter (Signed)
Patient returning call, CB is [phone].

## 2017-08-19 NOTE — Telephone Encounter (Signed)
[id number] x1 

## 2017-08-20 NOTE — Telephone Encounter (Signed)
Called and offered the appt with CY on Friday but the pt has another appt scheduled.  He is ok now on his refills but will need these done within the next month.  No held spots or openings in [facility] schedule for all of Dec.  KW please advise. Thanks

## 2017-08-20 NOTE — Telephone Encounter (Signed)
Spoke with pt and advised him of message. Appt made for 09/26/2017 at 11:30. Nothing further is needed.

## 2017-08-20 NOTE — Telephone Encounter (Signed)
Okay to double book 11:30am slot on Friday 09-26-17 thanks.

## 2017-09-26 ENCOUNTER — Ambulatory Visit: Payer: BLUE CROSS/BLUE SHIELD | Admitting: Internal Medicine

## 2017-09-26 ENCOUNTER — Encounter: Payer: Self-pay | Admitting: Internal Medicine

## 2017-09-26 DIAGNOSIS — J3089 Other allergic rhinitis: Secondary | ICD-10-CM | POA: Diagnosis not present

## 2017-09-26 DIAGNOSIS — J302 Other seasonal allergic rhinitis: Secondary | ICD-10-CM | POA: Diagnosis not present

## 2017-09-26 DIAGNOSIS — J45998 Other asthma: Secondary | ICD-10-CM

## 2017-09-26 MED ORDER — ALBUTEROL SULFATE HFA 108 (90 BASE) MCG/ACT IN AERS: 2.0000 | INHALATION_SPRAY | RESPIRATORY_TRACT | 12 refills | Status: DC | PRN

## 2017-09-26 MED ORDER — FLUTICASONE FUROATE-VILANTEROL 100-25 MCG/INH IN AEPB: INHALATION_SPRAY | RESPIRATORY_TRACT | 3 refills | Status: DC

## 2017-09-26 MED ORDER — THEOPHYLLINE ER 300 MG PO TB12: ORAL_TABLET | ORAL | 3 refills | Status: DC

## 2017-09-26 MED ORDER — FLUTICASONE FUROATE-VILANTEROL 100-25 MCG/INH IN AEPB: 1.0000 | INHALATION_SPRAY | Freq: Every day | RESPIRATORY_TRACT | 0 refills | Status: DC

## 2017-09-26 MED ORDER — FLUTICASONE PROPIONATE 50 MCG/ACT NA SUSP: 2.0000 | Freq: Every day | NASAL | 3 refills | Status: DC

## 2017-09-26 NOTE — Patient Instructions (Signed)
Script sent for [employer] Communications[employer]onase  Scripts printed for theophylline, albuterol HFA  Sample and script printed for Breo 100 maintenance controller.   Inhale 1 puff, then rinse mouth, once daily     Try this instead of theophylline. If you like it you can use the printed script.  Call if we can help
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
## 2017-09-28 NOTE — Assessment & Plan Note (Signed)
He depends on Flonase and sometimes an antihistamine seasonally but is doing quite well now.

## 2017-09-28 NOTE — Assessment & Plan Note (Signed)
He feels some chest tightness and light wheezing if he does not take his single theophylline daily, but otherwise he qualifies as mild intermittent uncomplicated.  We discussed trying to replace theophylline with an inhaler. Plan-refills provided but he is going to try Breo 100

## 2018-08-06 ENCOUNTER — Other Ambulatory Visit: Payer: Self-pay | Admitting: Internal Medicine

## 2018-08-14 ENCOUNTER — Ambulatory Visit
Admission: RE | Admit: 2018-08-14 | Discharge: 2018-08-14 | Disposition: A | Discharge status: Home / Self Care | Payer: BLUE CROSS/BLUE SHIELD | Source: Ambulatory Visit | Attending: Internal Medicine | Admitting: Internal Medicine

## 2018-09-28 ENCOUNTER — Encounter: Payer: Self-pay | Admitting: Internal Medicine

## 2018-09-28 ENCOUNTER — Ambulatory Visit (INDEPENDENT_AMBULATORY_CARE_PROVIDER_SITE_OTHER): Payer: BLUE CROSS/BLUE SHIELD | Admitting: Internal Medicine

## 2018-09-28 DIAGNOSIS — J45998 Other asthma: Secondary | ICD-10-CM | POA: Diagnosis not present

## 2018-09-28 MED ORDER — THEOPHYLLINE ER 300 MG PO TB12: ORAL_TABLET | ORAL | 3 refills | Status: DC

## 2018-09-28 MED ORDER — ALBUTEROL SULFATE 108 (90 BASE) MCG/ACT IN AEPB: 2.0000 | INHALATION_SPRAY | Freq: Four times a day (QID) | RESPIRATORY_TRACT | 12 refills | Status: DC | PRN
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
## 2018-09-28 NOTE — Patient Instructions (Signed)
Script printed changing ProAir inhaler to Respiclick device per insurance   You can call for other refills if needed

## 2018-12-04 NOTE — Assessment & Plan Note (Signed)
Stable mild uncomplicated.  He feels comfortable with low-dose theophylline which we discussed again.  He denies heartburn symptoms. Plan-refill rescue inhaler.  Okay to continue theophylline.  Will consider trying another maintenance controller in the future if needed.

## 2018-12-14 ENCOUNTER — Other Ambulatory Visit: Payer: Self-pay | Admitting: Internal Medicine

## 2019-07-12 IMAGING — US US ABDOMEN LIMITED
1 series · 14 of 25 positions shown · non-contrast
Comparison: Ultrasound 10/23/2014.  CT 10/29/2007.

CLINICAL DATA: Hyperbilirubinemia.

EXAM:
ULTRASOUND ABDOMEN LIMITED RIGHT UPPER QUADRANT

[Series 1: us abdomen limited · 0.17mm/px · 14 of 54 slices shown]
[im 1/54]
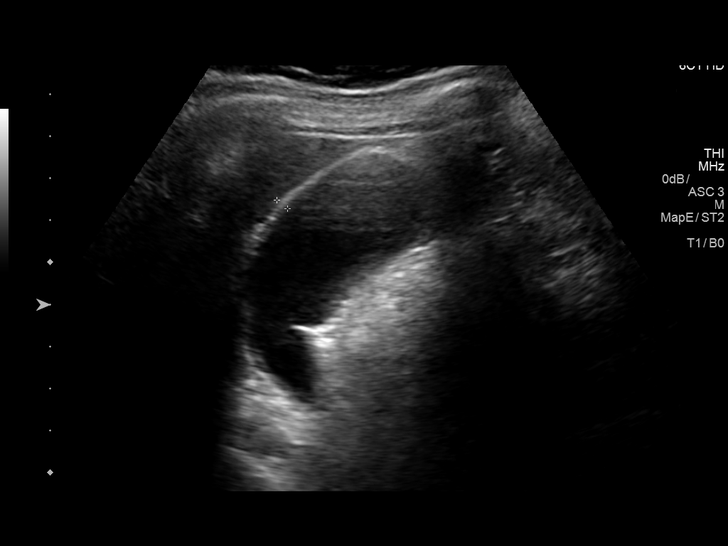
[im 5/54]
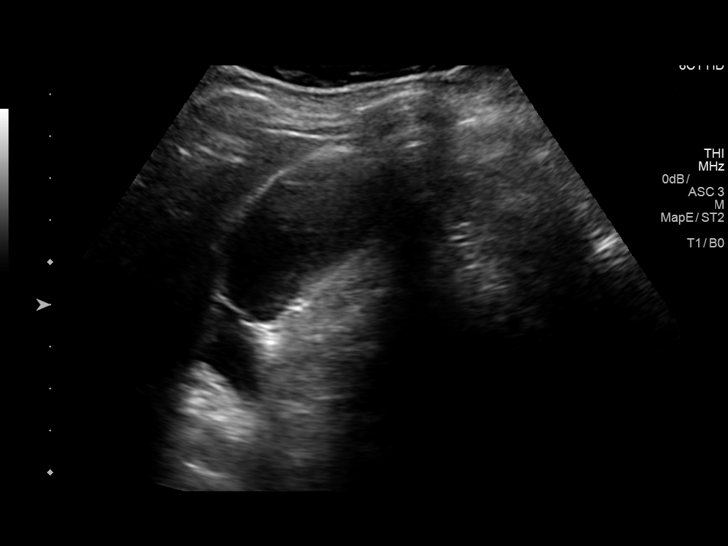
[im 9/54]
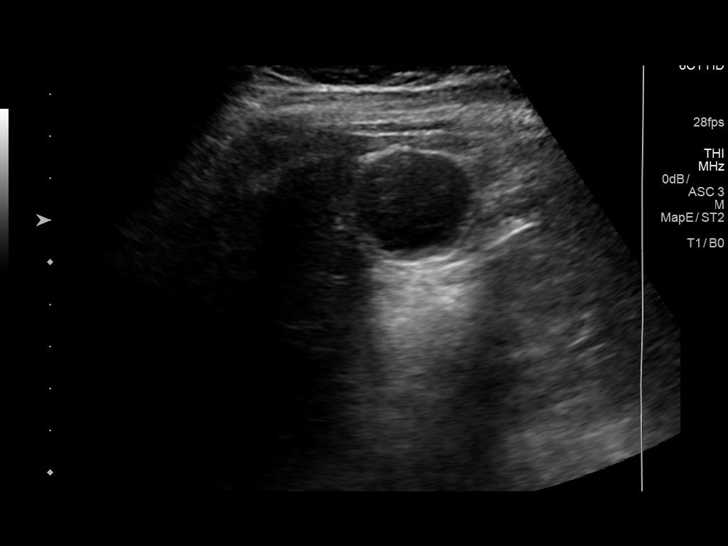
[im 14/54]
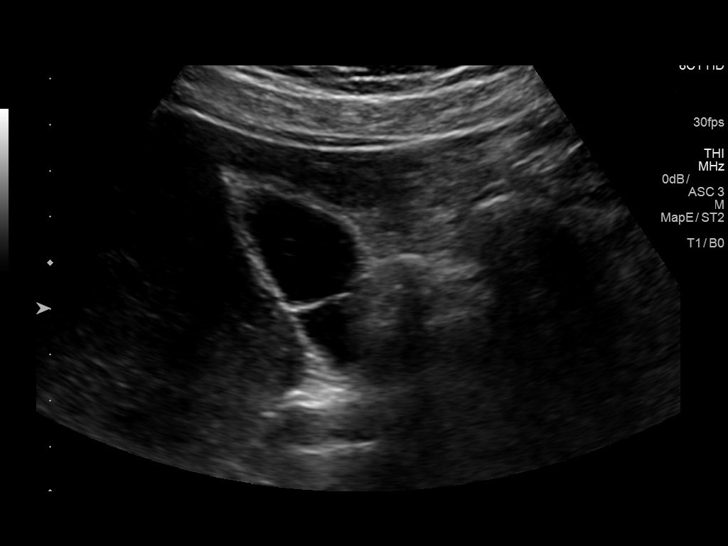
[im 18/54]
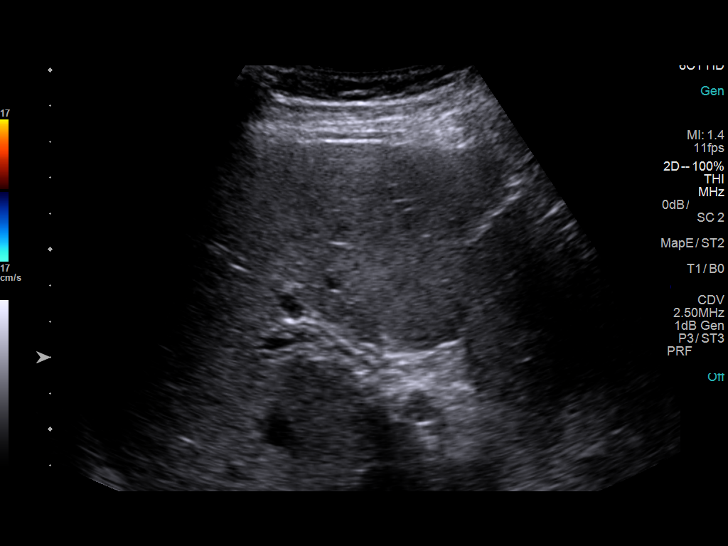
[im 20/54]
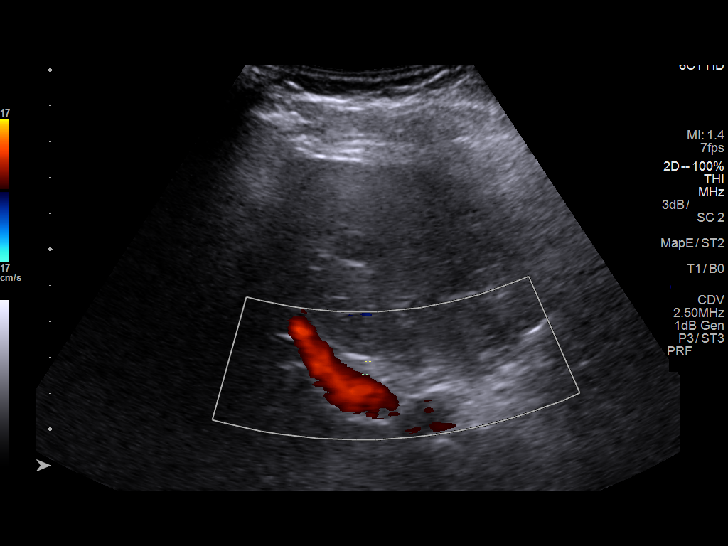
[im 25/54]
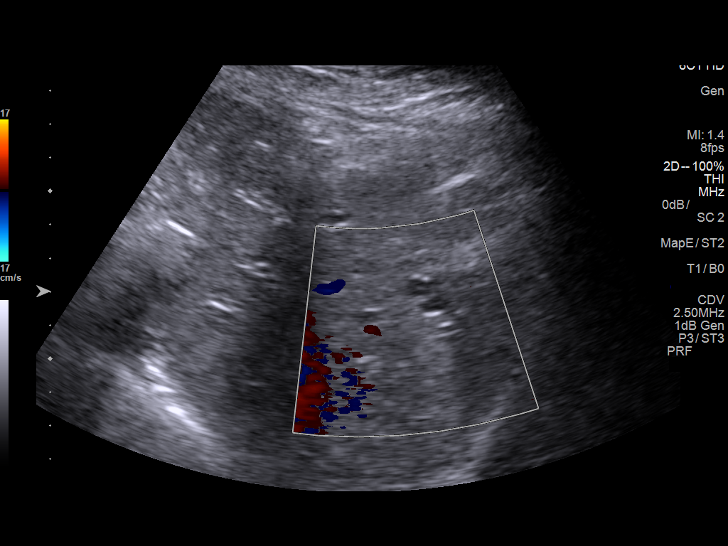
[im 29/54]
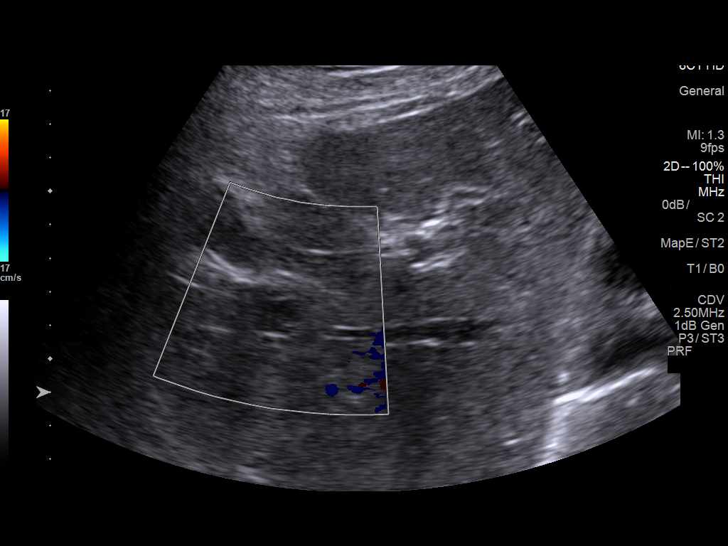
[im 34/54]
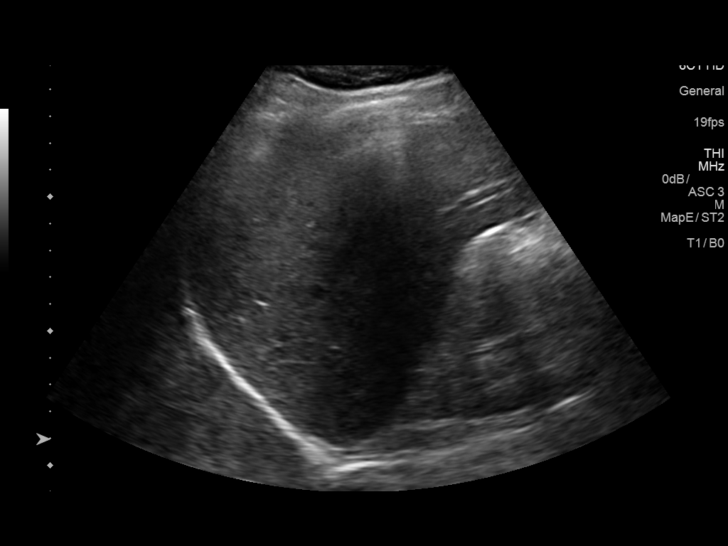
[im 36/54]
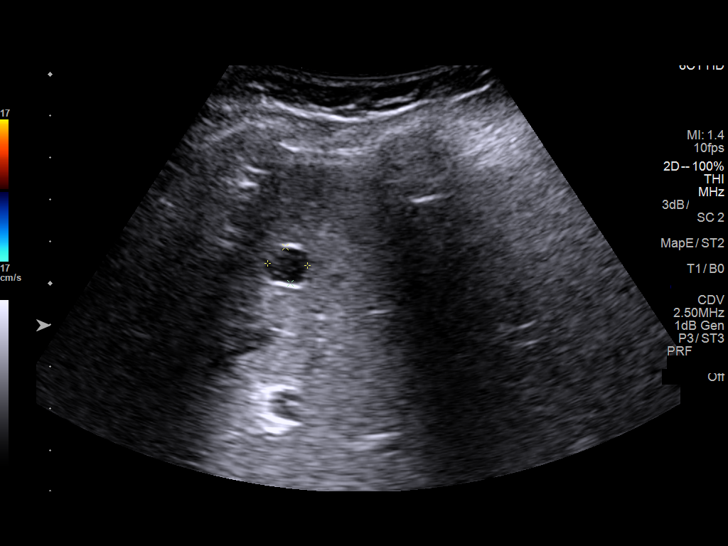
[im 40/54]
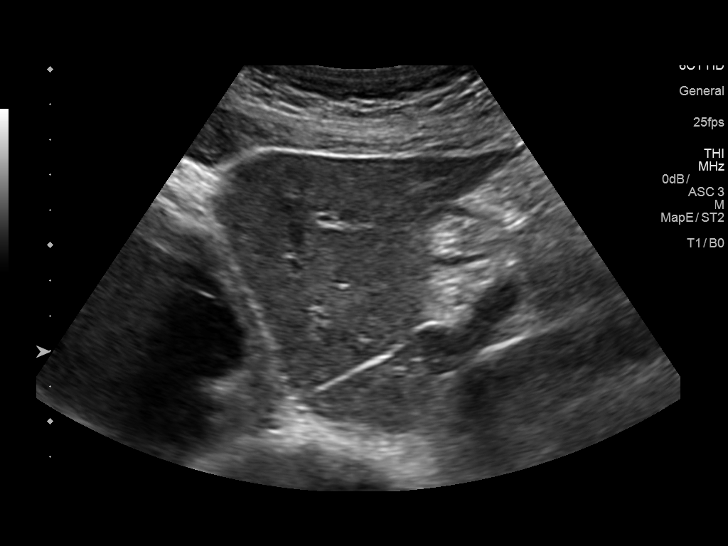
[im 45/54]
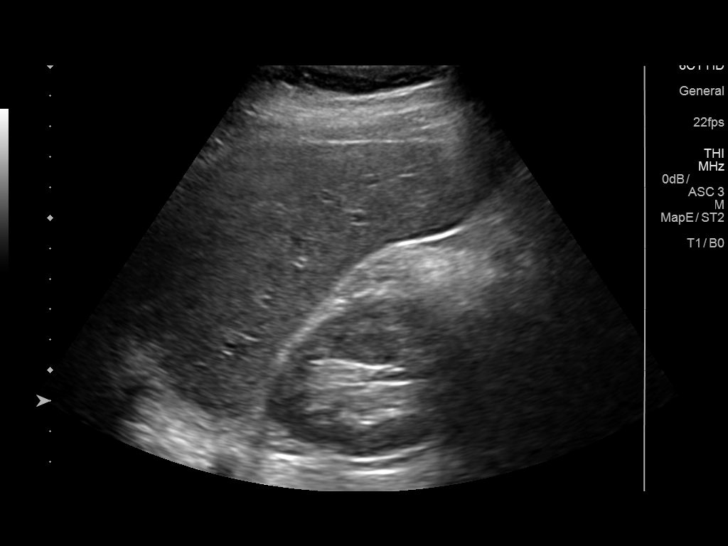
[im 49/54]
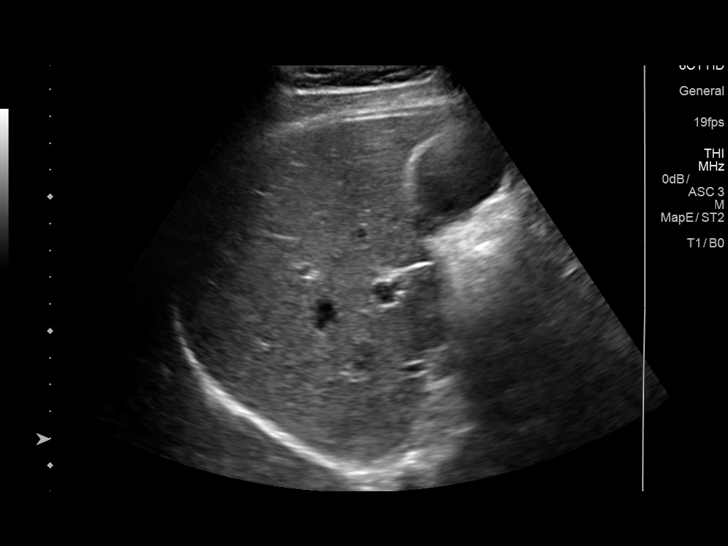
[im 54/54]
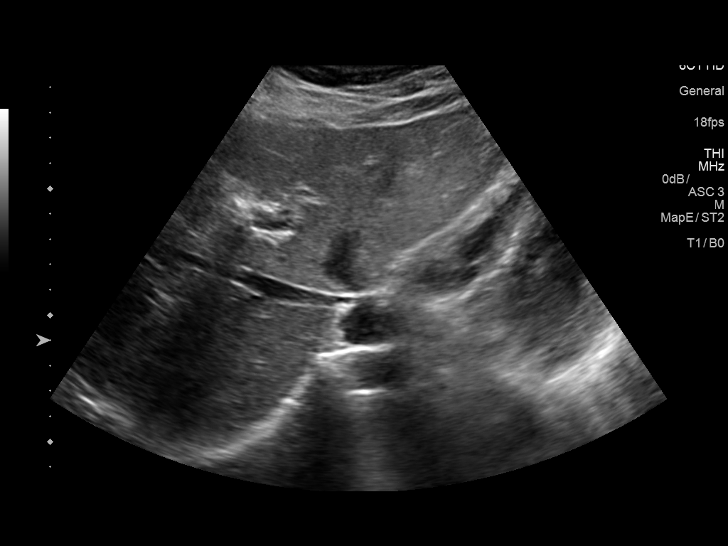

[14 of 25 positions shown; findings below may reference images not displayed]

FINDINGS: Gallbladder:

No gallstones noted. Gallbladder wall is slightly thickened at
mm. Negative Murphy sign.

Common bile duct:

Diameter: 3.8 mm

Liver:

Three tiny cysts noted in the right hepatic lobe, the largest
measures 1.0 cm and is most likely stable. These are most likely
benign. Portal vein is patent on color Doppler imaging with normal
direction of blood flow towards the liver.
IMPRESSION: Number no gallstones. Slight thickening of the gallbladder wall
mm. Slight thickening the gallbladder wall can be seen with
hypoproteinemic states and acalculous cholecystitis. Negative Murphy
sign. No biliary distention.

2.  3 small benign-appearing cysts right hepatic lobe.

## 2019-09-29 ENCOUNTER — Other Ambulatory Visit: Payer: Self-pay

## 2019-09-29 ENCOUNTER — Ambulatory Visit (INDEPENDENT_AMBULATORY_CARE_PROVIDER_SITE_OTHER): Payer: BC Managed Care – PPO | Admitting: Internal Medicine

## 2019-09-29 DIAGNOSIS — J302 Other seasonal allergic rhinitis: Secondary | ICD-10-CM | POA: Diagnosis not present

## 2019-09-29 DIAGNOSIS — J45998 Other asthma: Secondary | ICD-10-CM | POA: Diagnosis not present

## 2019-09-29 DIAGNOSIS — J3089 Other allergic rhinitis: Secondary | ICD-10-CM | POA: Diagnosis not present

## 2019-09-29 MED ORDER — THEOPHYLLINE ER 300 MG PO TB12: ORAL_TABLET | ORAL | 3 refills | Status: DC

## 2019-09-29 MED ORDER — FLUTICASONE PROPIONATE 50 MCG/ACT NA SUSP: 2.0000 | Freq: Every day | NASAL | 3 refills | Status: DC

## 2019-09-29 NOTE — Patient Instructions (Signed)
Refills for theophylline and flonase were sent to your mail order pharmacy  Glad to hear you have been doing well.  Please cal if we can help

## 2019-09-29 NOTE — Progress Notes (Signed)
Subjective:    Patient ID: [name] [name], male    DOB: 1970/12/18, 48 y.o.   MRN: [id number]  HPI male never smoker followed for Asthma, allergic rhinitis with history sinusitis  ---------------------------------------------------------------------  09/28/2018- 48 year old male never smoker followed for Asthma, allergic rhinitis with history sinusitis Theophylline 300 mg once daily, albuterol HFA, Breo 100, Flonase ----- Asthma: Pt denies any issues with SOB, cough, congestion, or wheezing since last OV. He uses 1 theophylline daily and says without it he feels chest tightness.  We discussed limited utilization of this medication class currently because of alternatives.  He rarely needs rescue inhaler and does not use Breo, feeling it is not needed.  Up-to-date on flu vaccine.  09/29/2019- Virtual Visit via Telephone Note  I connected with [name] [name] on 09/29/19 at  9:30 AM EST by telephone and verified that I am speaking with the correct person using two identifiers.  Location: Patient: H Provider: O   I discussed the limitations, risks, security and privacy concerns of performing an evaluation and management service by telephone and the availability of in person appointments. I also discussed with the patient that there may be a patient responsible charge related to this service. The patient expressed understanding and agreed to proceed.   History of Present Illness: 48 year old male never smoker followed for Asthma, allergic rhinitis with history sinusitis Theophylline 300 mg once daily, albuterol HFA, Breo 100, Flonase Not using Breo. Hasn't used rescue inhaler in a very long time.Still uses theophylline- discussed- well tolerated and he feels it helps. Continues Flonase when needed.    Observations/Objective:   Assessment and Plan: Asthma- mild intermittent uncomplicated- refilled theophylline Allergic Rhinitis- well controlled- refilled flonase  Follow Up  Instructions: 1 year   I discussed the assessment and treatment plan with the patient. The patient was provided an opportunity to ask questions and all were answered. The patient agreed with the plan and demonstrated an understanding of the instructions.   The patient was advised to call back or seek an in-person evaluation if the symptoms worsen or if the condition fails to improve as anticipated.  I provided 17 minutes of non-face-to-face time during this encounter.   Jetty Duhamel, MD    ROS-see HPI + = positive Constitutional:   No-   weight loss, night sweats, fevers, chills, fatigue, lassitude. HEENT:   +headaches, difficulty swallowing, tooth/dental problems, sore throat,       No-  sneezing, itching, ear ache, +nasal congestion, post nasal drip,  CV:  No-   chest pain, orthopnea, PND, swelling in lower extremities, anasarca,  dizziness, palpitations Resp: No-   shortness of breath with exertion or at rest.             No-productive cough,  No non-productive cough,  No- coughing up of blood.              No-   change in color of mucus.  No- wheezing.   Skin: No-   rash or lesions. GI:  No-   heartburn, indigestion, abdominal pain, nausea, vomiting,  GU:  MS:  No-   joint pain or swelling.  Neuro-     nothing unusual Psych:  No- change in mood or affect. No depression or anxiety.  No memory loss.  OBJ- Physical Exam appears fit General- Alert, Oriented, Affect-appropriate, Distress- none acute. Medium build Skin- rash-none, lesions- none, excoriation- none Lymphadenopathy- none Head- atraumatic            Eyes- Gross vision  intact, PERRLA, conjunctivae and secretions clear            Ears- Hearing, canals-normal            Nose-  no-Septal dev, mucus-none, no-polyps, erosion, perforation             Throat- Mallampati III , mucosa clear , drainage- none, tonsils- atrophic Neck- flexible , trachea midline, no stridor , thyroid nl, carotid no bruit Chest - symmetrical  excursion , unlabored           Heart/CV- RRR , no murmur , no gallop  , no rub, nl s1 s2                           - JVD- none , edema- none, stasis changes- none, varices- none           Lung- clear to P&A, wheeze- none, cough- none , dullness-none, rub- none           Chest wall-  Abd-  Br/ Gen/ Rectal- Not done, not indicated Extrem- cyanosis- none, clubbing, none, atrophy- none, strength- nl Neuro- grossly intact to observation

## 2019-11-14 ENCOUNTER — Encounter: Payer: Self-pay | Admitting: Internal Medicine

## 2019-11-14 NOTE — Assessment & Plan Note (Signed)
Unusual to treat anyone with theophylline these days, esp as the only routine med. He is comfortable with it, prefers the pill, and wishes to continue.

## 2019-11-14 NOTE — Assessment & Plan Note (Signed)
Ok to continue flonase when needed, as discussed. Not a rescue med.

## 2020-08-01 ENCOUNTER — Telehealth: Payer: Self-pay | Admitting: Internal Medicine

## 2020-08-01 MED ORDER — THEOPHYLLINE ER 300 MG PO TB12: ORAL_TABLET | ORAL | 3 refills | Status: DC

## 2020-08-01 NOTE — Telephone Encounter (Signed)
Called and spoke with patient who is asking for refill of theophylline. Patient has appt scheduled to see Dr. Maple Hudson on 09/28/20. Refill sent into preferred pharmacy. Nothing further needed at this time.

## 2020-09-27 NOTE — Progress Notes (Signed)
Subjective:    Patient ID: [name] [name], male    DOB: 09/26/1971, 49 y.o.   MRN: [id number]  HPI male never smoker followed for Asthma, allergic rhinitis with history sinusitis  ---------------------------------------------------------------------   09/29/2019- Virtual Visit via Telephone Note  History of Present Illness: 49 year old male never smoker followed for Asthma, allergic rhinitis with history sinusitis Theophylline 300 mg once daily, albuterol HFA, Breo 100, Flonase Not using Breo. Hasn't used rescue inhaler in a very long time.Still uses theophylline- discussed- well tolerated and he feels it helps. Continues Flonase when needed.    Observations/Objective:   Assessment and Plan: Asthma- mild intermittent uncomplicated- refilled theophylline Allergic Rhinitis- well controlled- refilled flonase  Follow Up Instructions: 1 year    09/28/20- 49 year old male never smoker followed for Asthma, allergic rhinitis with history sinusitis Theophylline 300 mg once daily, albuterol HFA, Breo 100- not using, Flonase Covid vax- 3 Phizer Flu vax- Had -----No concerns  ACT 20 He continues using theophylline once daily in the evening, noting some mild chest tightness if omitted. He denies sleep disturbance, heart burn and palpitation with this. Doesn't feel need for or use Breo or albuterol hfa but agrees to leave these on his med list for now. Denies other health issues.  ROS-see HPI + = positive Constitutional:   No-   weight loss, night sweats, fevers, chills, fatigue, lassitude. HEENT:   +headaches, difficulty swallowing, tooth/dental problems, sore throat,       No-  sneezing, itching, ear ache, +nasal congestion, post nasal drip,  CV:  No-   chest pain, orthopnea, PND, swelling in lower extremities, anasarca,  dizziness, palpitations Resp: No-   shortness of breath with exertion or at rest.             No-productive cough,  No non-productive cough,  No- coughing up of blood.               No-   change in color of mucus.  No- wheezing.   Skin: No-   rash or lesions. GI:  No-   heartburn, indigestion, abdominal pain, nausea, vomiting,  GU:  MS:  No-   joint pain or swelling.  Neuro-     nothing unusual Psych:  No- change in mood or affect. No depression or anxiety.  No memory loss.  OBJ- Physical Exam appears fit General- Alert, Oriented, Affect-appropriate, Distress- none acute. Medium build Skin- rash-none, lesions- none, excoriation- none Lymphadenopathy- none Head- atraumatic            Eyes- Gross vision intact, PERRLA, conjunctivae and secretions clear            Ears- Hearing, canals-normal            Nose-  no-Septal dev, mucus-none, no-polyps, erosion, perforation             Throat- Mallampati III , mucosa clear , drainage- none, tonsils- atrophic Neck- flexible , trachea midline, no stridor , thyroid nl, carotid no bruit Chest - symmetrical excursion , unlabored           Heart/CV- RRR , no murmur , no gallop, no rub, nl s1 s2                           - JVD- none , edema- none, stasis changes- none, varices- none           Lung- clear to P&A, wheeze- none, cough- none , dullness-none, rub- none  Chest wall-  Abd-  Br/ Gen/ Rectal- Not done, not indicated Extrem- cyanosis- none, clubbing, none, atrophy- none, strength- nl Neuro- grossly intact to observation

## 2020-09-28 ENCOUNTER — Other Ambulatory Visit: Payer: Self-pay

## 2020-09-28 ENCOUNTER — Ambulatory Visit (INDEPENDENT_AMBULATORY_CARE_PROVIDER_SITE_OTHER): Payer: BC Managed Care – PPO | Admitting: Internal Medicine

## 2020-09-28 ENCOUNTER — Encounter: Payer: Self-pay | Admitting: Internal Medicine

## 2020-09-28 DIAGNOSIS — J302 Other seasonal allergic rhinitis: Secondary | ICD-10-CM | POA: Diagnosis not present

## 2020-09-28 DIAGNOSIS — J3089 Other allergic rhinitis: Secondary | ICD-10-CM | POA: Diagnosis not present

## 2020-09-28 DIAGNOSIS — J45998 Other asthma: Secondary | ICD-10-CM

## 2020-09-28 MED ORDER — THEOPHYLLINE ER 300 MG PO TB12: ORAL_TABLET | ORAL | 3 refills | Status: DC

## 2020-09-28 MED ORDER — FLUTICASONE PROPIONATE 50 MCG/ACT NA SUSP
2.0000 | Freq: Every day | NASAL | 3 refills | Status: DC
Start: 2020-09-28 — End: 2021-10-30

## 2020-09-28 NOTE — Assessment & Plan Note (Signed)
Using flonase once daily. Feels well controlled. No epistaxis. Pllan- refilled

## 2020-09-28 NOTE — Assessment & Plan Note (Signed)
Theophylline is not considered a first-line med for asthma any more, but he remains satisfied with it and has preferred it. Potential side effects and alternatives reviewed.  Plan- refill theophylline

## 2020-09-28 NOTE — Patient Instructions (Signed)
Refills for theophylline and flonase sent to mail-order  Have a good year and please call if we can help

## 2021-02-28 ENCOUNTER — Other Ambulatory Visit: Payer: Self-pay

## 2021-02-28 ENCOUNTER — Ambulatory Visit (INDEPENDENT_AMBULATORY_CARE_PROVIDER_SITE_OTHER): Payer: BC Managed Care – PPO | Admitting: Family Medicine

## 2021-02-28 VITALS — BP 142/90 | Ht 71.0 in | Wt 185.0 lb

## 2021-02-28 DIAGNOSIS — M25522 Pain in left elbow: Secondary | ICD-10-CM

## 2021-02-28 MED ORDER — MELOXICAM 15 MG PO TABS: ORAL_TABLET | ORAL | 0 refills | Status: DC

## 2021-02-28 NOTE — Patient Instructions (Signed)
It was great to meet you today! Thank you for letting me participate in your care!  Today, we discussed your left elbow pain which is due to lateral epicondylitis. Please do the exercises and take Meloxicam as prescribed. I will see you back in 4 weeks and this should be improved.  Be well, Jules Schick, DO PGY-4, Sports Medicine Fellow [facility]

## 2021-02-28 NOTE — Progress Notes (Signed)
PCP: Rodrigo Ran, MD  Subjective:   HPI: Patient is a 50 y.o. male here for left elbow pain.  Experiencing lateral elbow pain x2 months. Described as a soreness. He is a Physicist, medical. Pain with turning wrist or bearing weight on wrist when biking. Tried his own home exercises as well as heat/ice, elbow band which helped with some relief. The band was most helpful.  He has not taken any medication for this issue. There is radiation from wrist into elbow. Has associated wrist tenderness but states this does not feel the same as when he had a wrist fracture. Denies any new injury to wrist or elbow.    Past Medical History:  Diagnosis Date  . Allergic rhinitis, cause unspecified   . Unspecified asthma(493.90)     Current Outpatient Medications on File Prior to Visit  Medication Sig Dispense Refill  . theophylline (THEODUR) 300 MG 12 hr tablet 1 twice daily with food 180 tablet 3  . Albuterol Sulfate (PROAIR RESPICLICK) 108 (90 Base) MCG/ACT AEPB Inhale 2 puffs into the lungs every 6 (six) hours as needed. 1 each 12  . atorvastatin (LIPITOR) 10 MG tablet Take 1 tablet by mouth daily.  4  . fluticasone (FLONASE) 50 MCG/ACT nasal spray Place 2 sprays into both nostrils daily. 48 g 3  . fluticasone furoate-vilanterol (BREO ELLIPTA) 100-25 MCG/INH AEPB Inhale 1 puff, then rinse mouth, once daily (Patient not taking: Reported on 09/28/2020) 180 each 3  . ibuprofen (ADVIL,MOTRIN) 200 MG tablet Take 200 mg by mouth 2 (two) times daily as needed (pain).    Marland Kitchen loratadine (CLARITIN) 10 MG tablet Take 10 mg by mouth daily as needed for allergies.     No current facility-administered medications on file prior to visit.    Past Surgical History:  Procedure Laterality Date  . LEFT HEART CATHETERIZATION WITH CORONARY ANGIOGRAM N/A 10/24/2014   Procedure: LEFT HEART CATHETERIZATION WITH CORONARY ANGIOGRAM;  Surgeon: Runell Gess, MD;  Location: [facility] CATH LAB;  Service: Cardiovascular;  Laterality:  N/A;    Allergies  Allergen Reactions  . Penicillins Other (See Comments)    Childhood allergic reaction  . Tetracycline Swelling    Throat swelled shut    Social History   Socioeconomic History  . Marital status: Married    Spouse name: Not on file  . Number of children: Not on file  . Years of education: Not on file  . Highest education level: Not on file  Occupational History  . Occupation: Counselling psychologist  Tobacco Use  . Smoking status: Never Smoker  . Smokeless tobacco: Never Used  Substance and Sexual Activity  . Alcohol use: Yes    Comment: Social  . Drug use: No  . Sexual activity: Not on file  Other Topics Concern  . Not on file  Social History Narrative  . Not on file   Social Determinants of Health   Financial Resource Strain: Not on file  Food Insecurity: Not on file  Transportation Needs: Not on file  Physical Activity: Not on file  Stress: Not on file  Social Connections: Not on file  Intimate Partner Violence: Not on file    No family history on file.  BP (!) 142/90   Ht 5\' 11"  (1.803 m)   Wt 185 lb (83.9 kg)   BMI 25.80 kg/m   [facility] 02/28/2021  Frequency of aerobic exercise (# of days/week) 2  Average time in minutes 30  Frequency of  strengthening activities (# of days/week) 4    No flowsheet data found.  Review of Systems: See HPI above.     Objective:  Physical Exam:  Gen: NAD, comfortable in exam room  Left elbow Inspection:  No gross deformity, swelling, bruising. Palpation: TTP over wrist extensor muscles, slightly distal to lateral epicondyle Motion: FROM. Strength: strength test 5/5.    Neuro: 2+ equal reflexes in triceps, biceps, brachioradialis tendons. Special test: Positive book test Vascular: NV intact distal BUEs.  Left wrist  Inspection:  No gross deformity, swelling, bruising. Palpation: TTP lateral wrist joint on dorsum side only between 4th and 5th digits.  Motion:  FROM Strength: strength tests 5/5 Neuro: Normal sensation over wrist joint Special test: Negative Finkelstein test    Assessment & Plan:  1. Left lateral epicondylitis x2 months pain with sporting activity. Tenderness located just distal to lateral epicondyle. Special test positive for tennis elbow. Treat conservatively, consider formal therapy if appropriate. Wrist exam non-specific, plan to follow up with possible plain films if no improvement with below treatment.  -Exercises for lateral epicondylitis -Start meloxicam 1x/daily -F/u in 4 weeks or sooner if needed -Consider wrist xray if not improved with tx  Resident Attestation   I saw and evaluated the patient, performing the key elements of the service.I  personally performed or re-performed the history, physical exam, and medical decision making activities of this service and have verified that the service and findings are accurately documented in the resident's note. I developed the management plan that is described in the resident's note, and I agree with the content, with my edits above.  Jules Schick, DO [facility], PGY-4  Addendum:  I was the preceptor for this visit and available for immediate consultation.  Norton Blizzard MD Marrianne Mood

## 2021-03-28 ENCOUNTER — Ambulatory Visit (INDEPENDENT_AMBULATORY_CARE_PROVIDER_SITE_OTHER): Payer: BC Managed Care – PPO | Admitting: Family Medicine

## 2021-03-28 ENCOUNTER — Other Ambulatory Visit: Payer: Self-pay

## 2021-03-28 DIAGNOSIS — M7710 Lateral epicondylitis, unspecified elbow: Secondary | ICD-10-CM | POA: Insufficient documentation

## 2021-03-28 DIAGNOSIS — M7712 Lateral epicondylitis, left elbow: Secondary | ICD-10-CM | POA: Diagnosis not present

## 2021-03-28 NOTE — Assessment & Plan Note (Signed)
Improved with HEP and meloxicam and activity modification. Significant improvement - Patient will only take meloxicam as needed  - Cont HEP; instructed patient he should see resolution of his symptoms with continued exercises in the next few weeks - F/u if no improvement or return of symptoms

## 2021-03-28 NOTE — Progress Notes (Signed)
    SUBJECTIVE:   CHIEF COMPLAINT / HPI:   F/u for left lateral epicondylitis [name] is doing well and states his pain has greatly improved with the home exercises and meloxicam. He denies any weakness, numbness, tingling, or burning. The lateral forearm still gets tender and sore sometimes if he works a lot at his computer with typing however this is much improved from before. He has had great results with taking meloxicam and was taking it scheduled but now only   PERTINENT  PMH / PSH: Hx of unstable angina, seasonal allergies  OBJECTIVE:   BP (!) 152/92   Ht 5\' 11"  (1.803 m)   Wt 180 lb (81.6 kg)   BMI 25.10 kg/m   [facility] 02/28/2021 03/28/2021  Frequency of aerobic exercise (# of days/week) 2 2  Average time in minutes 30 30  Frequency of strengthening activities (# of days/week) 4 4    MSK: No swelling, ecchymosis, erythema, or warmth. Examination of the left elbow significant for no TTP at the lateral epicondyle and full ROM actively at the elbow without pain. Slightly tender to palpation along the wrist extensor muscles. No pain or weakness with resistance to wrist extension, finger extension, finger abduction, grip strength.   ASSESSMENT/PLAN:   Lateral epicondylitis Improved with HEP and meloxicam and activity modification. Significant improvement - Patient will only take meloxicam as needed  - Cont HEP; instructed patient he should see resolution of his symptoms with continued exercises in the next few weeks - F/u if no improvement or return of symptoms     03/30/2021, DO PGY-4, Sports Medicine Fellow [facility]  Addendum:  I was the preceptor for this visit and available for immediate consultation.  [facility] MD Norton Blizzard

## 2021-09-02 ENCOUNTER — Emergency Department (HOSPITAL_BASED_OUTPATIENT_CLINIC_OR_DEPARTMENT_OTHER)
Admission: EM | Admit: 2021-09-02 | Discharge: 2021-09-02 | Disposition: A | Discharge status: Home / Self Care | Payer: BC Managed Care – PPO | Attending: Emergency Medicine | Admitting: Emergency Medicine

## 2021-09-02 ENCOUNTER — Other Ambulatory Visit: Payer: Self-pay

## 2021-09-02 ENCOUNTER — Emergency Department (HOSPITAL_BASED_OUTPATIENT_CLINIC_OR_DEPARTMENT_OTHER): Payer: BC Managed Care – PPO | Admitting: Radiology

## 2021-09-02 ENCOUNTER — Encounter (HOSPITAL_BASED_OUTPATIENT_CLINIC_OR_DEPARTMENT_OTHER): Payer: Self-pay

## 2021-09-02 DIAGNOSIS — Z955 Presence of coronary angioplasty implant and graft: Secondary | ICD-10-CM | POA: Insufficient documentation

## 2021-09-02 DIAGNOSIS — S93402A Sprain of unspecified ligament of left ankle, initial encounter: Secondary | ICD-10-CM | POA: Diagnosis not present

## 2021-09-02 DIAGNOSIS — S99912A Unspecified injury of left ankle, initial encounter: Secondary | ICD-10-CM | POA: Diagnosis present

## 2021-09-02 DIAGNOSIS — X501XXA Overexertion from prolonged static or awkward postures, initial encounter: Secondary | ICD-10-CM | POA: Diagnosis not present

## 2021-09-02 DIAGNOSIS — J45909 Unspecified asthma, uncomplicated: Secondary | ICD-10-CM | POA: Insufficient documentation

## 2021-09-02 DIAGNOSIS — Z7951 Long term (current) use of inhaled steroids: Secondary | ICD-10-CM | POA: Insufficient documentation

## 2021-09-02 HISTORY — DX: Unspecified asthma, uncomplicated: J45.909

## 2021-09-02 NOTE — Discharge Instructions (Signed)

## 2021-09-02 NOTE — ED Triage Notes (Signed)
Pt arrives ambulatory to ED with slight limp c/o pain to left ankle after missing a step while blowing leaves yesterday.

## 2021-09-02 NOTE — ED Provider Notes (Addendum)
[facility] EMERGENCY DEPT Provider Note   CSN: [id number] Arrival date & time: 09/02/21  1013     History Chief Complaint  Patient presents with   Ankle Pain    [name] [name] is a 50 y.o. male.  HPI  50 year old male with a history of allergic rhinitis, asthma, who presents the emergency department today for evaluation of left ankle pain.  States he was stepping off of a step yesterday when he rolled his ankle.  Since then he has had constant pain.  It is worse with weightbearing but he has been able to walk.  He denies any other injuries at this time.  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  Social History   Tobacco Use   Smoking status: Never   Smokeless tobacco: Never  Vaping Use   Vaping Use: Never used  Substance Use Topics   Alcohol use: Yes    Comment: Social   Drug use: No    Home Medications Prior to Admission medications   Medication Sig Start Date End Date Taking? Authorizing Provider  Albuterol Sulfate (PROAIR RESPICLICK) 108 (90 Base) MCG/ACT AEPB Inhale 2 puffs into the lungs every 6 (six) hours as needed. 09/28/18   Jetty Duhamel D, MD  atorvastatin (LIPITOR) 10 MG tablet Take 1 tablet by mouth daily. 04/23/16   [provider]   fluticasone (FLONASE) 50 MCG/ACT nasal spray Place 2 sprays into both nostrils daily. 09/28/20   Jetty Duhamel D, MD  fluticasone furoate-vilanterol (BREO ELLIPTA) 100-25 MCG/INH AEPB Inhale 1 puff, then rinse mouth, once daily Patient not taking: Reported on 09/28/2020 09/26/17   Waymon Budge, MD  ibuprofen (ADVIL,MOTRIN) 200 MG tablet Take 200 mg by mouth 2 (two) times daily as needed (pain).    [provider]  loratadine (CLARITIN) 10 MG tablet Take 10 mg by mouth daily as needed for allergies.    [provider]  meloxicam (MOBIC) 15 MG tablet Take 1 tablet daily with food for 7 days. Then take as needed. 02/28/21   Arlyce Harman, MD  theophylline (THEODUR) 300 MG 12 hr tablet 1 twice daily with food 09/28/20   Jetty Duhamel D, MD    Allergies    Penicillins and Tetracycline  Review of Systems   Review of Systems  Constitutional:  Negative for fever.  Musculoskeletal:  Positive for arthralgias.  Skin:  Negative for wound.  Neurological:  Negative for weakness and numbness.   Physical Exam Updated Vital Signs BP 135/86 (BP Location: Right Arm)   Pulse 76   Temp 98.3 F (36.8 C)   Resp 16   Ht 5\' 11"  (1.803 m)   Wt 81.6 kg   SpO2 96%   BMI 25.10 kg/m   Physical Exam Constitutional:  General: He is not in acute distress.    Appearance: He is well-developed.  Eyes:     Conjunctiva/sclera: Conjunctivae normal.  Cardiovascular:     Rate and Rhythm: Normal rate.  Pulmonary:     Effort: Pulmonary effort is normal.  Musculoskeletal:     Comments: TTP to the medial and lateral malleolus. No foot ttp. Nvi distally. From of the ankle without deformity  Skin:    General: Skin is warm and dry.  Neurological:     Mental Status: He is alert and oriented to person, place, and time.    ED Results / Procedures / Treatments   Labs (all labs ordered are listed, but only abnormal results are displayed) Labs Reviewed - No data to  display  EKG None  Radiology DG Ankle Complete Left  Result Date: 09/02/2021 CLINICAL DATA:  C/o stepped wrong yesterday and has pain around lateral malleolus of Lt ankle. Hx bilat ankle fractures as a child EXAM: LEFT ANKLE COMPLETE - 3+ VIEW COMPARISON:  None. FINDINGS: There is no evidence of fracture, dislocation, or joint effusion. There is a well corticated osseous fragment at the tip of the lateral malleolus likely related to remote prior trauma. There are small osseous fragments at the tip of the medial malleolus likely also related to remote prior injury. Mild lateral soft tissue swelling. IMPRESSION: 1. No acute fracture or dislocation in the left ankle. 2. Likely sequela of remote prior trauma. Electronically Signed   By: Emmaline Kluver M.D.   On: 09/02/2021 11:45    Procedures Procedures   SPLINT APPLICATION Date/Time: 8:52 PM Authorized by: Karrie Meres Consent: Verbal consent obtained. Risks and benefits: risks, benefits and alternatives were discussed Consent given by: patient Splint applied by: technician Location details: lle Splint type: aso ankle splint Supplies used: aso ankle splint Post-procedure: The splinted body part was neurovascularly unchanged following the procedure. Patient tolerance: Patient tolerated the procedure well with no immediate complications.    Medications Ordered in ED Medications - No data to display  ED Course  I have reviewed the triage vital signs and the nursing notes.  Pertinent labs & imaging results that were available during my care of the patient were reviewed by me and considered in my medical decision making (see chart for details).    MDM Rules/Calculators/A&P                          Patient presenting with ankle pain after twisting ankle yesterday  Vital signs stable and patient nontoxic-appearing.  X-ray of left ankle negative for acute fracture abnormality.  A splint was applied. Pt declines crutches.  OrthO  follow-up given and patient advised to follow-up with either PCP or orthopedics in 1 week for reevaluation.  Advised Tylenol, ibuprofen, and rice protocol for pain.  Advised to return to the ER for any new or worsening symptoms in the meantime.  All questions were answered and patient understands plan and reasons to return.  Final Clinical Impression(s) / ED Diagnoses Final diagnoses:  Sprain of left ankle, unspecified ligament, initial encounter    Rx / DC Orders ED Discharge Orders     None        Karrie Meres, PA-C 09/02/21 1239    Jermisha Hoffart S, PA-C 09/02/21 2052    Alvira Monday, MD 09/03/21 0005

## 2021-09-28 ENCOUNTER — Ambulatory Visit: Payer: BC Managed Care – PPO | Admitting: Internal Medicine

## 2021-10-29 NOTE — Progress Notes (Signed)
Subjective:    Patient ID: [name] [name], male    DOB: [birth year], 51 y.o.   MRN: [id number]  HPI male never smoker followed for Asthma, allergic rhinitis with history sinusitis  ---------------------------------------------------------------------   09/28/20- 51 year old male never smoker followed for Asthma, allergic rhinitis with history sinusitis Theophylline 300 mg once daily, albuterol HFA, Breo 100- not using, Flonase Covid vax- 3 Phizer Flu vax- Had -----No concerns  ACT 20 He continues using theophylline once daily in the evening, noting some mild chest tightness if omitted. He denies sleep disturbance, heart burn and palpitation with this. Doesn't feel need for or use Breo or albuterol hfa but agrees to leave these on his med list for now. Denies other health issues.  10/30/21- 3 year-old male never smoker followed for Asthma, allergic Rhinitis with history sinusitis, complicated by Hyperlipidemia, [name] Syndrome,  Theophylline 300 mg once daily, albuterol HFA, Flonase Covid vax- 3 Phizer Flu vax- had ------Patient is doing good, no concerns ACT score 25 He has not felt need to use rescue albuterol in a couple of years.  We will keep it on his list in case that changes.  He is not needing a maintenance controller.  He does continue theophylline and has not tried off of it recently. COVID infection December 2022 treated by Dr. Waynard Edwards with molnupiravir. No residual.  He does anticipate spring rhinitis and asked refill Flonase.  ROS-see HPI + = positive Constitutional:   No-   weight loss, night sweats, fevers, chills, fatigue, lassitude. HEENT:   +headaches, difficulty swallowing, tooth/dental problems, sore throat,       No-  sneezing, itching, ear ache, +nasal congestion, post nasal drip,  CV:  No-   chest pain, orthopnea, PND, swelling in lower extremities, anasarca,  dizziness, palpitations Resp: No-   shortness of breath with exertion or at rest.              No-productive cough,  No non-productive cough,  No- coughing up of blood.              No-   change in color of mucus.  No- wheezing.   Skin: No-   rash or lesions. GI:  No-   heartburn, indigestion, abdominal pain, nausea, vomiting,  GU:  MS:  No-   joint pain or swelling.  Neuro-     nothing unusual Psych:  No- change in mood or affect. No depression or anxiety.  No memory loss.  OBJ- Physical Exam appears fit General- Alert, Oriented, Affect-appropriate, Distress- none acute. Medium build Skin- rash-none, lesions- none, excoriation- none Lymphadenopathy- none Head- atraumatic            Eyes- Gross vision intact, PERRLA, conjunctivae and secretions clear            Ears- Hearing, canals-normal            Nose-  no-Septal dev, mucus-none, no-polyps, erosion, perforation             Throat- Mallampati III , mucosa clear , drainage- none, tonsils- atrophic Neck- flexible , trachea midline, no stridor , thyroid nl, carotid no bruit Chest - symmetrical excursion , unlabored           Heart/CV- RRR , no murmur , no gallop, no rub, nl s1 s2                           - JVD- none , edema- none, stasis changes- none, varices-  none           Lung- clear to P&A, wheeze- none, cough- none , dullness-none, rub- none           Chest wall-  Abd-  Br/ Gen/ Rectal- Not done, not indicated Extrem- cyanosis- none, clubbing, none, atrophy- none, strength- nl Neuro- grossly intact to observation

## 2021-10-30 ENCOUNTER — Encounter: Payer: Self-pay | Admitting: Internal Medicine

## 2021-10-30 ENCOUNTER — Other Ambulatory Visit: Payer: Self-pay

## 2021-10-30 ENCOUNTER — Ambulatory Visit (INDEPENDENT_AMBULATORY_CARE_PROVIDER_SITE_OTHER): Payer: BC Managed Care – PPO | Admitting: Internal Medicine

## 2021-10-30 DIAGNOSIS — J3089 Other allergic rhinitis: Secondary | ICD-10-CM

## 2021-10-30 DIAGNOSIS — J4520 Mild intermittent asthma, uncomplicated: Secondary | ICD-10-CM

## 2021-10-30 DIAGNOSIS — J302 Other seasonal allergic rhinitis: Secondary | ICD-10-CM

## 2021-10-30 MED ORDER — FLUTICASONE PROPIONATE 50 MCG/ACT NA SUSP: 2.0000 | Freq: Every day | NASAL | 3 refills | Status: AC

## 2021-10-30 MED ORDER — THEOPHYLLINE ER 300 MG PO TB12
ORAL_TABLET | ORAL | 3 refills | Status: DC
Start: 2021-10-30 — End: 2022-10-31

## 2021-10-30 NOTE — Assessment & Plan Note (Signed)
He continues to believe theophylline has been helpful for him and well-tolerated he has chosen to continue it over use of a maintenance inhaler. Plan-refill theophylline

## 2021-10-30 NOTE — Assessment & Plan Note (Signed)
He anticipates seasonal symptoms this spring. Plan-refill Flonase with discussion

## 2021-10-30 NOTE — Patient Instructions (Signed)
Theophyline and Flonase refills sent to [facility]  Please call if we can help

## 2022-08-01 ENCOUNTER — Ambulatory Visit (INDEPENDENT_AMBULATORY_CARE_PROVIDER_SITE_OTHER): Payer: BC Managed Care – PPO | Admitting: Sports Medicine

## 2022-08-01 VITALS — BP 134/84 | Ht 71.0 in | Wt 185.0 lb

## 2022-08-01 DIAGNOSIS — M722 Plantar fascial fibromatosis: Secondary | ICD-10-CM | POA: Diagnosis not present

## 2022-08-01 MED ORDER — MELOXICAM 15 MG PO TABS: ORAL_TABLET | ORAL | 0 refills | Status: DC

## 2022-08-01 NOTE — Progress Notes (Signed)
[name] [name] - 51 y.o. male MRN [id number]  Date of birth: 20-Jun-1971    CHIEF COMPLAINT:   Left heel pain    SUBJECTIVE:   HPI:  Very pleasant 51 year old male comes to clinic to be evaluated for left heel pain.  This has been bothering him for several months now.  He has a sharp stabbing pain on the medial aspect at the base of the heel.  It is worst with stepping out of bed first thing in the morning.  It does get better as the day goes on.  He has tried stretching, ice, nighttime stretching sock when he sleeps but nothing is really made it better.  He has not tried any anti-inflammatories, although he did take Mobic in the past for his lateral epicondylitis and did well with that.  He has not tried any physical therapy or injections or shockwave.  ROS:     See HPI  PERTINENT  PMH / PSH FH / / SH:  Past Medical, Surgical, Social, and Family History Reviewed & Updated in the EMR.  Pertinent findings include:  None  OBJECTIVE: There were no vitals taken for this visit.  Physical Exam:  Vital signs are reviewed.  GEN: Alert and oriented, NAD Pulm: Breathing unlabored PSY: normal mood, congruent affect  MSK: Left foot -no obvious deformity.  No overlying erythema.  The left heel is tender to palpation at the base of calcaneus near the insertion of the plantar fascia.  He is also mildly tender to palpation along the more distal aspects of the plantar fascia as it approaches the forefoot.  He is nontender to palpation at the medial malleolus, lateral malleolus, navicular, or base of fifth metatarsal.  Full range of motion at the ankle and 5/5 strength throughout.  He has negative anterior drawer and talar tilt test.  Positive windlass test.  Is neurovascular intact distally  ULTRASOUND: Limited ultrasound of the left heel was performed in clinic today.  The plantar fascia thickness was measured just distal to the insertion along the calcaneus and was found to be 0.75 cm and there were  hypoechoic changes surrounding the insertion.  This was significantly enlarged compared to the contralateral side which only measured 0.4 cm and there were no hypoechoic echoic changes on the contralateral side.  Impression: Plantar fasciitis of the left foot  ASSESSMENT & PLAN:  1.  Left foot plantar fasciitis -History, exam, ultrasound findings all consistent with plantar fasciitis of the left foot.  Recommended continued conservative treatments such as stretching, ice, shoe wear with supportive arches and the nighttime stretching sock.  We will start a trial of Mobic once daily for a week to try to calm down some inflammation. Follow-up in 3 weeks.  If no improvement at that time, we may start shockwave therapy of the left plantar fascia. All questions answered and agrees to plan.  Dortha Kern, MD PGY-4, Sports Medicine Fellow [place]  Patient seen and evaluated with the sports medicine fellow.  I agree with the above plan of care.  Patient will start calf stretching in addition to his plantar fascial stretching.  I also recommended some arch support for his cavus foot.  He has an off-the-shelf orthotic that seems to be quite comfortable.  We will also have him try some meloxicam 15 mg daily with food.  Follow-up in 3 weeks for evaluation.  If no improvement, we will consider shockwave therapy for his left Planter fasciitis.  This note was  dictated using Dragon naturally speaking software and may contain errors in syntax, spelling, or content which have not been identified prior to signing this note.

## 2022-08-19 ENCOUNTER — Other Ambulatory Visit: Payer: Self-pay | Admitting: Internal Medicine

## 2022-08-19 DIAGNOSIS — E785 Hyperlipidemia, unspecified: Secondary | ICD-10-CM

## 2022-09-24 ENCOUNTER — Ambulatory Visit
Admission: RE | Admit: 2022-09-24 | Discharge: 2022-09-24 | Disposition: A | Discharge status: Home / Self Care | Payer: No Typology Code available for payment source | Source: Ambulatory Visit | Attending: Internal Medicine | Admitting: Internal Medicine

## 2022-09-24 DIAGNOSIS — E785 Hyperlipidemia, unspecified: Secondary | ICD-10-CM

## 2022-10-30 NOTE — Progress Notes (Signed)
Subjective:    Patient ID: [name] [name], male    DOB: Jan 08, 1971, 52 y.o.   MRN: [id number]  HPI male never smoker followed for Asthma, allergic rhinitis with history sinusitis  ---------------------------------------------------------------------   10/30/21- 12 year-old male never smoker followed for Asthma, allergic Rhinitis with history sinusitis, complicated by Hyperlipidemia, Rosanna Randy Syndrome,  Theophylline 300 mg once daily, albuterol HFA, Flonase Covid vax- 3 Phizer Flu vax- had ------Patient is doing good, no concerns ACT score 25 He has not felt need to use rescue albuterol in a couple of years.  We will keep it on his list in case that changes.  He is not needing a maintenance controller.  He does continue theophylline and has not tried off of it recently. COVID infection December 2022 treated by Dr. Joylene Draft with molnupiravir. No residual.  He does anticipate spring rhinitis and asked refill Flonase.  10/31/22- 31 year-old male never smoker followed for Asthma, allergic Rhinitis with history sinusitis, complicated by Hyperlipidemia, Rosanna Randy Syndrome,  -Theophylline 300 mg once daily, albuterol HFA, Flonase Covid vax- 3 Phizer Flu vax- had ACT score 25 He continues to feel that he does well with theophylline.  We again discussed alternatives.  Never uses rescue inhaler but says he has 1 if ever needed. He denies acute respiratory events or significant health changes since last year.  ROS-see HPI + = positive Constitutional:   No-   weight loss, night sweats, fevers, chills, fatigue, lassitude. HEENT:   +headaches, difficulty swallowing, tooth/dental problems, sore throat,       No-  sneezing, itching, ear ache, +nasal congestion, post nasal drip,  CV:  No-   chest pain, orthopnea, PND, swelling in lower extremities, anasarca,  dizziness, palpitations Resp: No-   shortness of breath with exertion or at rest.             No-productive cough,  No non-productive cough,  No-  coughing up of blood.              No-   change in color of mucus.  No- wheezing.   Skin: No-   rash or lesions. GI:  No-   heartburn, indigestion, abdominal pain, nausea, vomiting,  GU:  MS:  No-   joint pain or swelling.  Neuro-     nothing unusual Psych:  No- change in mood or affect. No depression or anxiety.  No memory loss.  OBJ- Physical Exam appears fit General- Alert, Oriented, Affect-appropriate, Distress- none acute. Medium build Skin- rash-none, lesions- none, excoriation- none Lymphadenopathy- none Head- atraumatic            Eyes- Gross vision intact, PERRLA, conjunctivae and secretions clear            Ears- Hearing, canals-normal            Nose-  no-Septal dev, mucus-none, no-polyps, erosion, perforation             Throat- Mallampati III , mucosa clear , drainage- none, tonsils- atrophic Neck- flexible , trachea midline, no stridor , thyroid nl, carotid no bruit Chest - symmetrical excursion , unlabored           Heart/CV- RRR , no murmur , no gallop, no rub, nl s1 s2                           - JVD- none , edema- none, stasis changes- none, varices- none  Lung- clear to P&A, wheeze- none, cough- none , dullness-none, rub- none           Chest wall-  Abd-  Br/ Gen/ Rectal- Not done, not indicated Extrem- cyanosis- none, clubbing, none, atrophy- none, strength- nl Neuro- grossly intact to observation

## 2022-10-31 ENCOUNTER — Ambulatory Visit (INDEPENDENT_AMBULATORY_CARE_PROVIDER_SITE_OTHER): Payer: BC Managed Care – PPO | Admitting: Internal Medicine

## 2022-10-31 ENCOUNTER — Encounter: Payer: Self-pay | Admitting: Internal Medicine

## 2022-10-31 VITALS — BP 124/78 | HR 69 | Ht 71.0 in | Wt 190.8 lb

## 2022-10-31 DIAGNOSIS — J4520 Mild intermittent asthma, uncomplicated: Secondary | ICD-10-CM | POA: Diagnosis not present

## 2022-10-31 DIAGNOSIS — J302 Other seasonal allergic rhinitis: Secondary | ICD-10-CM | POA: Diagnosis not present

## 2022-10-31 DIAGNOSIS — J3089 Other allergic rhinitis: Secondary | ICD-10-CM

## 2022-10-31 MED ORDER — ALBUTEROL SULFATE HFA 108 (90 BASE) MCG/ACT IN AERS: 2.0000 | INHALATION_SPRAY | Freq: Four times a day (QID) | RESPIRATORY_TRACT | 12 refills | Status: DC | PRN

## 2022-10-31 MED ORDER — THEOPHYLLINE ER 300 MG PO TB12: ORAL_TABLET | ORAL | 3 refills | Status: DC

## 2022-10-31 NOTE — Patient Instructions (Signed)
Theophylline refilled  Glad you are doing well- please call if we can help

## 2022-11-11 ENCOUNTER — Encounter: Payer: Self-pay | Admitting: Internal Medicine

## 2022-11-11 MED ORDER — THEOPHYLLINE ER 300 MG PO TB12
ORAL_TABLET | ORAL | 3 refills | Status: DC
Start: 2022-11-11 — End: 2023-11-03

## 2022-11-11 NOTE — Telephone Encounter (Signed)
Dr. Annamaria Boots please advise on the following My Chart message.   [name] [name]  P [facility] (supporting Deneise Lever, MD)4 hours ago (8:27 AM)    Can you enter my prescription on [facility]?  I am having an issue with the pricing at [facility].

## 2022-11-11 NOTE — Telephone Encounter (Signed)
Theophylline 41-month script with refills sent to [facility]

## 2022-11-19 NOTE — Telephone Encounter (Signed)
Pt notified of refill via My Chart. Nothing further needed at this time.

## 2022-12-04 ENCOUNTER — Encounter: Payer: Self-pay | Admitting: Internal Medicine

## 2022-12-04 NOTE — Assessment & Plan Note (Signed)
Unusual that he prefers theophylline.  Appropriate discussions have been done.  No reason to change long as he is doing well. Plan-refill theophylline with discussion

## 2022-12-04 NOTE — Assessment & Plan Note (Signed)
Not currently significant allergy season for him.  We discussed OTC medications-Flonase, antihistamine-if needed this spring.

## 2022-12-17 ENCOUNTER — Ambulatory Visit (INDEPENDENT_AMBULATORY_CARE_PROVIDER_SITE_OTHER): Payer: BC Managed Care – PPO | Admitting: Sports Medicine

## 2022-12-17 VITALS — BP 138/84 | Ht 71.0 in | Wt 185.0 lb

## 2022-12-17 DIAGNOSIS — M722 Plantar fascial fibromatosis: Secondary | ICD-10-CM

## 2022-12-17 MED ORDER — MELOXICAM 15 MG PO TABS: 15.0000 mg | ORAL_TABLET | Freq: Every day | ORAL | 1 refills | Status: AC | PRN

## 2022-12-17 NOTE — Progress Notes (Cosign Needed)
PCP: Crist Infante, MD  Subjective:   HPI: Patient is a 52 y.o. [name] here for left heel pain follow-up.  Patient was seen about 5 months ago on 08/01/2022 for left heel pain diagnosed with plantars fasciitis.  He was prescribed meloxicam, treated conservatively with stretching and shoe wear with supportive arches.  Since the last visit, patient reports that the left heel pain did improve while he was on the meloxicam, but after he ran out, he continued to have intermittent left heel pain. Pain is worse the first step of the day and standing up after prolonged periods of sitting. He has been doing plantar fascia stretches nightly.  He did not like the nighttime stretching sock. He is using a new over-the-counter shoe insert.  He plays golf and mountain biking.  Past Medical History:  Diagnosis Date   Allergic rhinitis, cause unspecified    Asthma    Unspecified asthma(493.90)     Current Outpatient Medications on File Prior to Visit  Medication Sig Dispense Refill   albuterol (VENTOLIN HFA) 108 (90 Base) MCG/ACT inhaler Inhale 2 puffs into the lungs every 6 (six) hours as needed for wheezing or shortness of breath. 8 g 12   atorvastatin (LIPITOR) 10 MG tablet Take 1 tablet by mouth daily.  4   fluticasone (FLONASE) 50 MCG/ACT nasal spray Place 2 sprays into both nostrils daily. 48 g 3   loratadine (CLARITIN) 10 MG tablet Take 10 mg by mouth daily as needed for allergies.     theophylline (THEODUR) 300 MG 12 hr tablet 1 twice daily with food 180 tablet 3   No current facility-administered medications on file prior to visit.    Past Surgical History:  Procedure Laterality Date   LEFT HEART CATHETERIZATION WITH CORONARY ANGIOGRAM N/A 10/24/2014   Procedure: LEFT HEART CATHETERIZATION WITH CORONARY ANGIOGRAM;  Surgeon: Lorretta Harp, MD;  Location: [facility] CATH LAB;  Service: Cardiovascular;  Laterality: N/A;    Allergies  Allergen Reactions   Penicillins Other (See Comments)     Childhood allergic reaction   Tetracycline Swelling    Throat swelled shut    Social History   Socioeconomic History   Marital status: Married    Spouse name: Not on file   Number of children: Not on file   Years of education: Not on file   Highest education level: Not on file  Occupational History   Occupation: Writer  Tobacco Use   Smoking status: Never   Smokeless tobacco: Never  Vaping Use   Vaping Use: Never used  Substance and Sexual Activity   Alcohol use: Yes    Comment: Social   Drug use: No   Sexual activity: Not on file  Other Topics Concern   Not on file  Social History Narrative   Not on file   Social Determinants of Health   Financial Resource Strain: Not on file  Food Insecurity: Not on file  Transportation Needs: Not on file  Physical Activity: Not on file  Stress: Not on file  Social Connections: Not on file  Intimate Partner Violence: Not on file    No family history on file.  BP 138/84   Ht '5\' 11"'$  (1.803 m)   Wt 185 lb (83.9 kg)   BMI 25.80 kg/m   Review of Systems: See HPI above.     Objective:  Physical Exam:  Gen: awake, alert, NAD, comfortable in exam room Pulm: breathing unlabored  Left foot: Pes cavus noted.  Tenderness to  palpation along the medial calcaneus.  No pain with plantarflexion or dorsiflexion.  2+ DP pulses.  Procedure: ECSWT Indications: Plantar fasciitis, left   Procedure Details Consent: Risks of procedure as well as the alternatives and risks of each were explained to the patient.  Written consent for procedure obtained. Time Out: Verified patient identification, verified procedure, site was marked, verified correct patient position, medications/allergies/relevent history reviewed.  The area was cleaned with alcohol swab.     The left plantar fascia was targeted for Extracorporeal shockwave therapy.    Preset: 2500 Power Level: 90 Frequency: 10 Impulse/cycles:  Head size:    Patient  tolerated procedure well without immediate complications       Assessment & Plan:  1.  Plantar fasciitis of the left foot History and exam consistent with plantar fasciitis.  Refractory to conservative measures, I think he would benefit from a trial of shockwave therapy.  He may also benefit from custom orthotic given pes cavus. - first shockwave therapy performed today, he will continue shockwave weekly for 4 weeks then reassess - he will contact his insurance to see if we can get him a custom orthotic - continue plantar fascia stretches, he will add on calf stretches.  Handout provided - refill meloxicam which he can take as needed  Zola Button, MD [place], PGY-3

## 2022-12-24 ENCOUNTER — Ambulatory Visit (INDEPENDENT_AMBULATORY_CARE_PROVIDER_SITE_OTHER): Payer: Self-pay | Admitting: Sports Medicine

## 2022-12-24 DIAGNOSIS — M722 Plantar fascial fibromatosis: Secondary | ICD-10-CM

## 2022-12-25 NOTE — Progress Notes (Signed)
Patient ID: [name] [name], male   DOB: [birth year], 52 y.o.   MRN: [id number]  Patient presents today for soundwave treatment #2 for left foot Planter fasciitis.  He has not noticed any improvement in symptoms.  He did get somewhat sore after his initial treatment last week.  Second treatment performed as below.  He tolerates this without difficulty.  He is scheduled next week for his third treatment but we may forego that in lieu of custom orthotics if he does not notice any benefit from today's treatment.  Alternatively, if he does think that today's treatment was helpful, we we will perform soundwave treatment #3 in addition to custom orthotics.  I would also like for Dony to get an x-ray of his left heel prior to next week's visit.  Procedure: ECSWT Indications: Planter fasciitis   Procedure Details Consent: Risks of procedure as well as the alternatives and risks of each were explained to the patient.  Written consent for procedure obtained. Time Out: Verified patient identification, verified procedure, site was marked, verified correct patient position, medications/allergies/relevent history reviewed.  The area was cleaned with alcohol swab.     The left plantar fascia was targeted for Extracorporeal shockwave therapy.    Preset: Planter fasciitis Power Level: 90 Frequency: 10 Impulse/cycles: 2500  Head size: Large   Patient tolerated procedure well without immediate complications   This note was dictated using Dragon naturally speaking software and may contain errors in syntax, spelling, or content which have not been identified prior to signing this note.

## 2022-12-31 ENCOUNTER — Ambulatory Visit (INDEPENDENT_AMBULATORY_CARE_PROVIDER_SITE_OTHER): Payer: Self-pay | Admitting: Sports Medicine

## 2022-12-31 ENCOUNTER — Ambulatory Visit (INDEPENDENT_AMBULATORY_CARE_PROVIDER_SITE_OTHER): Payer: BC Managed Care – PPO | Admitting: Sports Medicine

## 2022-12-31 VITALS — BP 148/74 | Ht 71.0 in | Wt 185.0 lb

## 2022-12-31 DIAGNOSIS — M722 Plantar fascial fibromatosis: Secondary | ICD-10-CM

## 2023-01-01 NOTE — Progress Notes (Signed)
Patient ID: [name] [name], male   DOB: 1971/06/25, 52 y.o.   MRN: [id number]  [name] [name] presents today for follow-up on plantar fasciitis.  We decided to forego his third soundwave treatment as he has not noticed any benefit with this with the first 2 treatments.  Instead, we decided to make him custom orthotics.  Orthotics were created as below.  He found them to be comfortable prior to leaving the office.  Gait was neutral with orthotics in place.  I have ordered an x-ray of his left calcaneus and I would like for him to proceed with that.  I also explained that if the x-ray was unremarkable but he continued to have pain despite construction of today's orthotics and we should consider further diagnostic imaging.  He understands.  He will follow-up with me as needed.  Patient was fitted for a : standard, cushioned, semi-rigid orthotic. The orthotic was heated and afterward the patient stood on the orthotic blank positioned on the orthotic stand. The patient was positioned in subtalar neutral position and 10 degrees of ankle dorsiflexion in a weight bearing stance. After completion of molding, a stable base was applied to the orthotic blank. The blank was ground to a stable position for weight bearing. Size: 10 Base: Blue EVA Posting: None Additional orthotic padding: None

## 2023-01-02 ENCOUNTER — Ambulatory Visit
Admission: RE | Admit: 2023-01-02 | Discharge: 2023-01-02 | Disposition: A | Discharge status: Home / Self Care | Payer: BC Managed Care – PPO | Source: Ambulatory Visit | Attending: Sports Medicine | Admitting: Sports Medicine

## 2023-01-02 DIAGNOSIS — M722 Plantar fascial fibromatosis: Secondary | ICD-10-CM

## 2023-01-06 ENCOUNTER — Encounter: Payer: Self-pay | Admitting: Sports Medicine

## 2023-01-07 ENCOUNTER — Ambulatory Visit: Payer: BC Managed Care – PPO | Admitting: Sports Medicine

## 2023-05-12 IMAGING — DX DG ANKLE COMPLETE 3+V*L*
3 series · 3 of 3 positions shown · non-contrast
Comparison: None.

CLINICAL DATA: C/o stepped wrong yesterday and has pain around
lateral malleolus of Lt ankle. Hx bilat ankle fractures as a child

EXAM:
LEFT ANKLE COMPLETE - 3+ VIEW

[ankle ap]
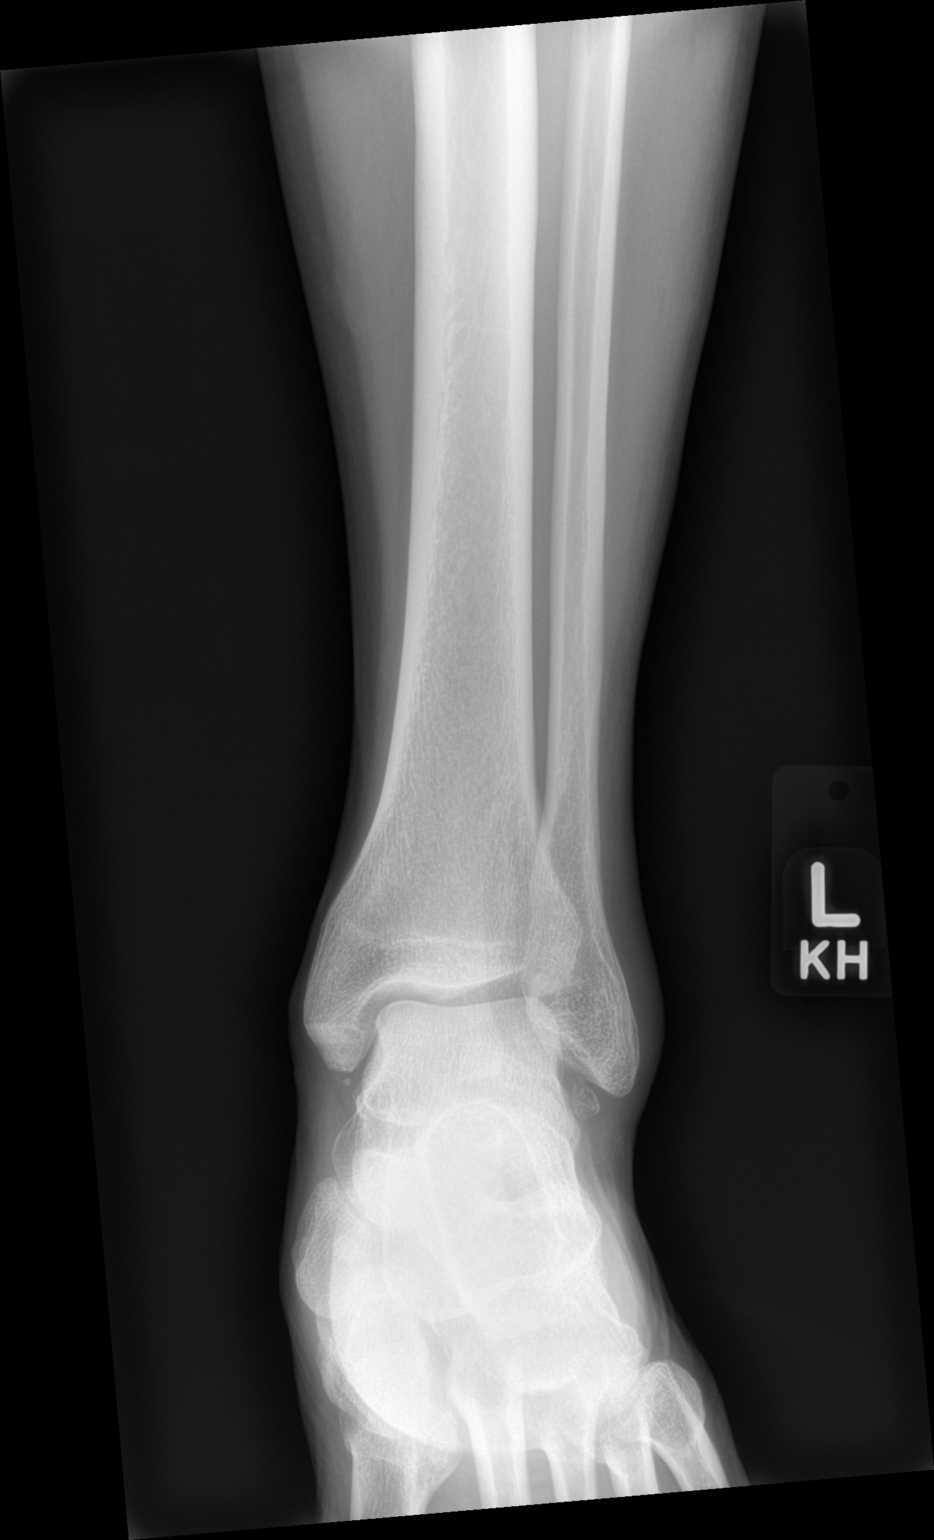

[ankle obl]
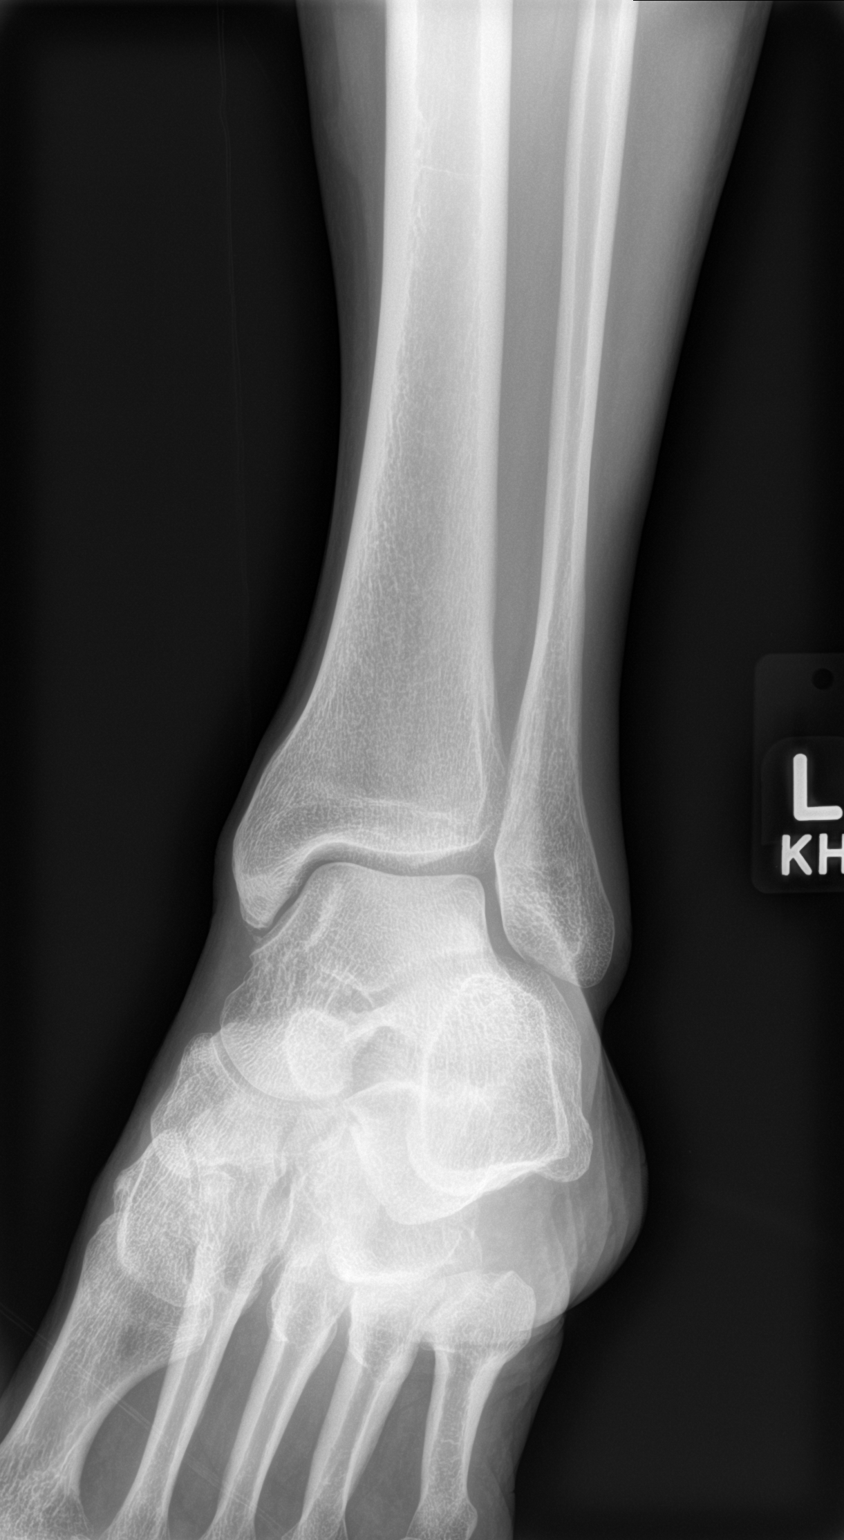

[ankle lat]
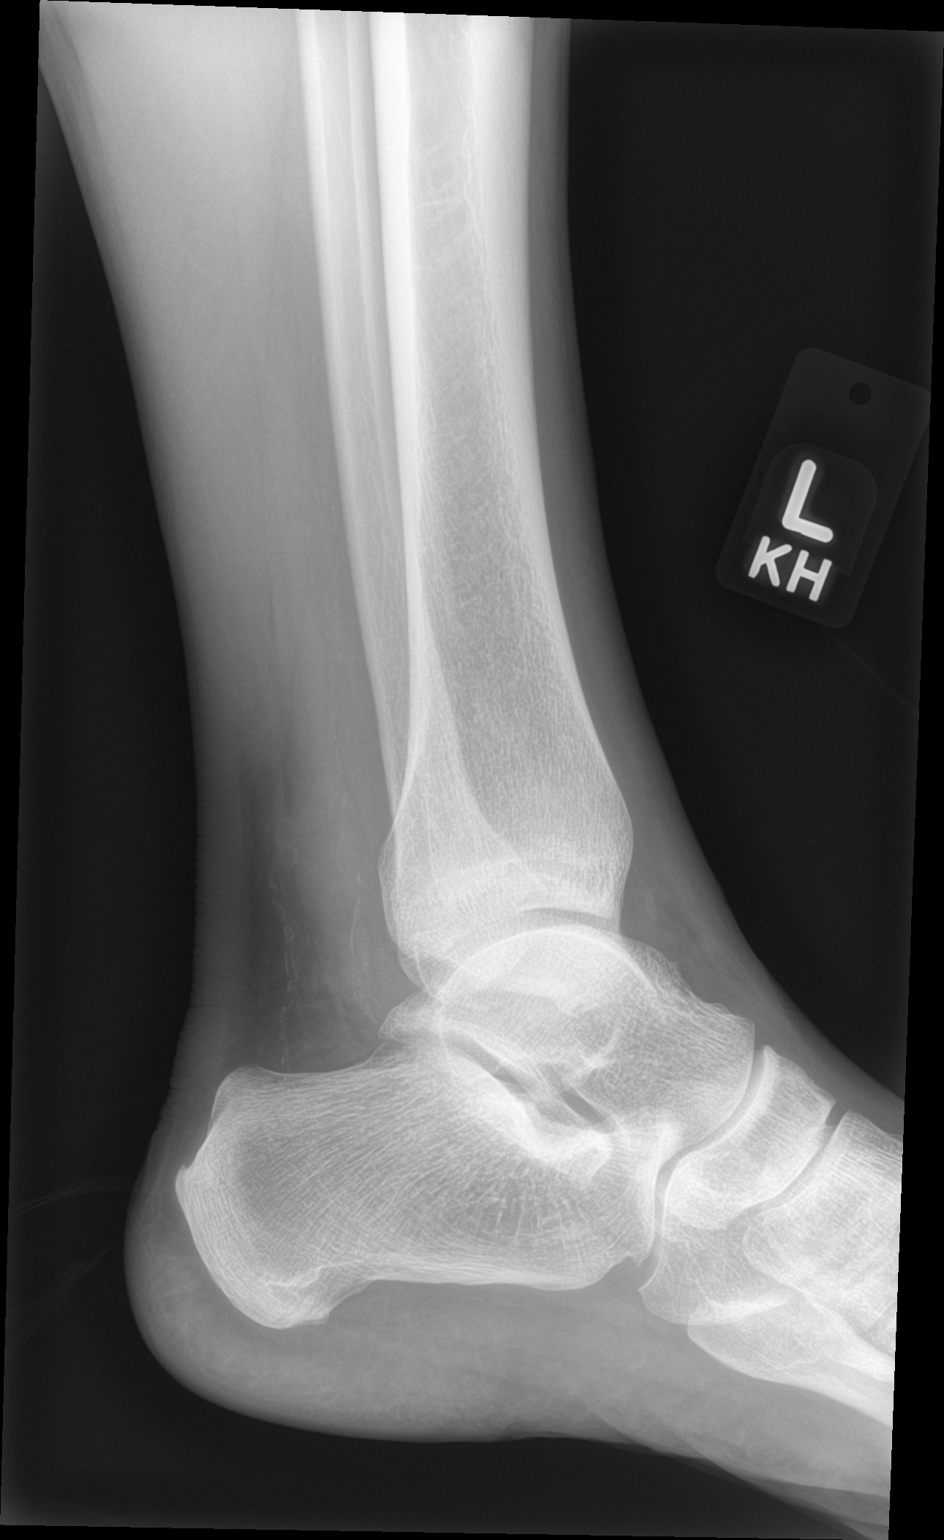

[3 of 3 positions shown; findings below may reference images not displayed]

FINDINGS: There is no evidence of fracture, dislocation, or joint effusion.
There is a well corticated osseous fragment at the tip of the
lateral malleolus likely related to remote prior trauma. There are
small osseous fragments at the tip of the medial malleolus likely
also related to remote prior injury. Mild lateral soft tissue
swelling.
IMPRESSION: 1. No acute fracture or dislocation in the left ankle.
2. Likely sequela of remote prior trauma.

## 2023-10-31 NOTE — Progress Notes (Unsigned)
Subjective:    Patient ID: [name] [name], male    DOB: [birth year], 53 y.o.   MRN: [id number]  HPI male never smoker followed for Asthma, allergic rhinitis with history sinusitis  ---------------------------------------------------------------------   10/31/22- 40 year-old male never smoker followed for Asthma, allergic Rhinitis with history sinusitis, complicated by Hyperlipidemia, [name] Syndrome,  -Theophylline 300 mg once daily, albuterol HFA, Flonase Covid vax- 3 Phizer Flu vax- had ACT score 25 He continues to feel that he does well with theophylline.  We again discussed alternatives.  Never uses rescue inhaler but says he has 1 if ever needed. He denies acute respiratory events or significant health changes since last year.  11/03/23- 27 year-old male never smoker followed for Asthma, allergic Rhinitis with history sinusitis, complicated by Hyperlipidemia, [name] Syndrome,  -Theophylline 300 mg once daily, albuterol HFA, Flonase Discussed the use of AI scribe software for clinical note transcription with the patient, who gave verbal consent to proceed.  History of Present Illness   The patient, with a history of asthma, presents for a routine follow-up. He reports no breathing flare-ups in the past year and continues to manage his symptoms with once daily theophylline. He has not needed to use his rescue inhaler.  In addition to his asthma, the patient has been dealing with plantar fasciitis, which has been causing heel pain. Despite the discomfort, he reports that the condition is improving and he has become accustomed to the pain.  The patient also has a history of heartburn and acid reflux, but he denies any current symptoms. He is currently taking metformin, but the reason for this medication is not specified in the conversation.      ROS-see HPI + = positive Constitutional:   No-   weight loss, night sweats, fevers, chills, fatigue, lassitude. HEENT:   +headaches,  difficulty swallowing, tooth/dental problems, sore throat,       No-  sneezing, itching, ear ache, +nasal congestion, post nasal drip,  CV:  No-   chest pain, orthopnea, PND, swelling in lower extremities, anasarca,  dizziness, palpitations Resp: No-   shortness of breath with exertion or at rest.             No-productive cough,  No non-productive cough,  No- coughing up of blood.              No-   change in color of mucus.  No- wheezing.   Skin: No-   rash or lesions. GI:  No-   heartburn, indigestion, abdominal pain, nausea, vomiting,  GU:  MS:  No-   joint pain or swelling.  Neuro-     nothing unusual Psych:  No- change in mood or affect. No depression or anxiety.  No memory loss.  OBJ- Physical Exam appears fit General- Alert, Oriented, Affect-appropriate, Distress- none acute. Medium build Skin- rash-none, lesions- none, excoriation- none Lymphadenopathy- none Head- atraumatic            Eyes- Gross vision intact, PERRLA, conjunctivae and secretions clear            Ears- Hearing, canals-normal            Nose-  no-Septal dev, mucus-none, no-polyps, erosion, perforation             Throat- Mallampati III , mucosa clear , drainage- none, tonsils- atrophic Neck- flexible , trachea midline, no stridor , thyroid nl, carotid no bruit Chest - symmetrical excursion , unlabored  Heart/CV- RRR , no murmur , no gallop, no rub, nl s1 s2                           - JVD- none , edema- none, stasis changes- none, varices- none           Lung- clear to P&A, wheeze- none, cough- none , dullness-none, rub- none           Chest wall-  Abd-  Br/ Gen/ Rectal- Not done, not indicated Extrem- cyanosis- none, clubbing, none, atrophy- none, strength- nl Neuro- grossly intact to observation  Assessment and Plan    Asthma Stable with no recent exacerbations. Currently on Theophylline 1 pill daily and has a rescue inhaler (Ventolin) but does not require it. No exacerbations. -Continue  Theophylline 1 pill daily. -Renew Ventolin prescription but do not send it in unless patient needs it.  Plantar Fasciitis Chronic heel pain, improving slightly. -Continue current management.  General Health Maintenance / Followup Plans -See patient in 1 year unless issues arise.

## 2023-11-03 ENCOUNTER — Encounter: Payer: Self-pay | Admitting: Internal Medicine

## 2023-11-03 ENCOUNTER — Ambulatory Visit: Payer: BC Managed Care – PPO | Admitting: Internal Medicine

## 2023-11-03 VITALS — BP 134/76 | HR 68 | Temp 97.6°F | Resp 18 | Ht 71.0 in | Wt 194.6 lb

## 2023-11-03 DIAGNOSIS — J4520 Mild intermittent asthma, uncomplicated: Secondary | ICD-10-CM | POA: Diagnosis not present

## 2023-11-03 MED ORDER — ALBUTEROL SULFATE HFA 108 (90 BASE) MCG/ACT IN AERS: 2.0000 | INHALATION_SPRAY | Freq: Four times a day (QID) | RESPIRATORY_TRACT | Status: AC | PRN

## 2023-11-03 MED ORDER — THEOPHYLLINE ER 300 MG PO TB12: ORAL_TABLET | ORAL | 3 refills | Status: AC

## 2023-11-03 NOTE — Patient Instructions (Signed)
Theophylline refill sent  Ventolin refill available if needed

## 2024-11-02 ENCOUNTER — Ambulatory Visit: Payer: BC Managed Care – PPO | Admitting: Internal Medicine

## 2024-11-15 ENCOUNTER — Encounter

## 2024-12-03 ENCOUNTER — Encounter
